# Patient Record
Sex: Female | Born: 1955 | Race: White | Hispanic: No | Marital: Single | Smoking: Never smoker
Health system: Southern US, Community
[De-identification: ages and names within clinical notes are randomized; demographics above are authoritative.]

## PROBLEM LIST (undated history)

## (undated) DIAGNOSIS — R55 Syncope and collapse: Secondary | ICD-10-CM

## (undated) DIAGNOSIS — N959 Unspecified menopausal and perimenopausal disorder: Secondary | ICD-10-CM

## (undated) DIAGNOSIS — F325 Major depressive disorder, single episode, in full remission: Secondary | ICD-10-CM

## (undated) DIAGNOSIS — G4700 Insomnia, unspecified: Secondary | ICD-10-CM

## (undated) DIAGNOSIS — E7800 Pure hypercholesterolemia, unspecified: Secondary | ICD-10-CM

## (undated) DIAGNOSIS — E079 Disorder of thyroid, unspecified: Secondary | ICD-10-CM

## (undated) DIAGNOSIS — K259 Gastric ulcer, unspecified as acute or chronic, without hemorrhage or perforation: Secondary | ICD-10-CM

## (undated) HISTORY — DX: Syncope and collapse: R55

## (undated) HISTORY — DX: Major depressive disorder, single episode, in full remission: F32.5

## (undated) HISTORY — PX: KNEE ARTHROSCOPY: SUR90

## (undated) HISTORY — PX: ESOPHAGOGASTRODUODENOSCOPY: SHX1529

## (undated) HISTORY — DX: Pure hypercholesterolemia, unspecified: E78.00

## (undated) HISTORY — PX: OTHER SURGICAL HISTORY: SHX169

## (undated) HISTORY — DX: Unspecified menopausal and perimenopausal disorder: N95.9

## (undated) HISTORY — DX: Gastric ulcer, unspecified as acute or chronic, without hemorrhage or perforation: K25.9

## (undated) HISTORY — DX: Disorder of thyroid, unspecified: E07.9

## (undated) HISTORY — PX: BUNIONECTOMY: SHX129

## (undated) HISTORY — PX: CHOLECYSTECTOMY: SHX55

## (undated) HISTORY — DX: Insomnia, unspecified: G47.00

## (undated) HISTORY — PX: COLONOSCOPY: SHX174

---

## 1998-01-29 ENCOUNTER — Ambulatory Visit (HOSPITAL_COMMUNITY): Admission: RE | Admit: 1998-01-29 | Discharge: 1998-01-29 | Payer: Self-pay | Admitting: Obstetrics and Gynecology

## 1998-06-26 ENCOUNTER — Encounter: Payer: Self-pay | Admitting: Orthopedic Surgery

## 1998-06-26 ENCOUNTER — Ambulatory Visit (HOSPITAL_COMMUNITY): Admission: RE | Admit: 1998-06-26 | Discharge: 1998-06-26 | Payer: Self-pay | Admitting: Orthopedic Surgery

## 1999-04-18 ENCOUNTER — Encounter: Payer: Self-pay | Admitting: Obstetrics and Gynecology

## 1999-04-18 ENCOUNTER — Ambulatory Visit (HOSPITAL_COMMUNITY): Admission: RE | Admit: 1999-04-18 | Discharge: 1999-04-18 | Payer: Self-pay | Admitting: Obstetrics and Gynecology

## 1999-09-05 ENCOUNTER — Other Ambulatory Visit: Admission: RE | Admit: 1999-09-05 | Discharge: 1999-09-05 | Payer: Self-pay | Admitting: Obstetrics and Gynecology

## 2000-05-17 ENCOUNTER — Ambulatory Visit (HOSPITAL_COMMUNITY): Admission: RE | Admit: 2000-05-17 | Discharge: 2000-05-17 | Payer: Self-pay | Admitting: Gastroenterology

## 2000-05-20 ENCOUNTER — Encounter: Payer: Self-pay | Admitting: Obstetrics and Gynecology

## 2000-05-20 ENCOUNTER — Ambulatory Visit (HOSPITAL_COMMUNITY): Admission: RE | Admit: 2000-05-20 | Discharge: 2000-05-20 | Payer: Self-pay | Admitting: Obstetrics and Gynecology

## 2000-10-20 ENCOUNTER — Other Ambulatory Visit: Admission: RE | Admit: 2000-10-20 | Discharge: 2000-10-20 | Payer: Self-pay | Admitting: Obstetrics and Gynecology

## 2001-02-11 ENCOUNTER — Ambulatory Visit (HOSPITAL_COMMUNITY): Admission: RE | Admit: 2001-02-11 | Discharge: 2001-02-11 | Payer: Self-pay | Admitting: Obstetrics and Gynecology

## 2001-02-11 ENCOUNTER — Encounter: Payer: Self-pay | Admitting: Obstetrics and Gynecology

## 2001-09-16 ENCOUNTER — Encounter: Payer: Self-pay | Admitting: Obstetrics and Gynecology

## 2001-09-16 ENCOUNTER — Ambulatory Visit (HOSPITAL_COMMUNITY): Admission: RE | Admit: 2001-09-16 | Discharge: 2001-09-16 | Payer: Self-pay | Admitting: Obstetrics and Gynecology

## 2001-12-13 ENCOUNTER — Other Ambulatory Visit: Admission: RE | Admit: 2001-12-13 | Discharge: 2001-12-13 | Payer: Self-pay | Admitting: Obstetrics and Gynecology

## 2002-12-21 ENCOUNTER — Ambulatory Visit (HOSPITAL_COMMUNITY): Admission: RE | Admit: 2002-12-21 | Discharge: 2002-12-21 | Payer: Self-pay | Admitting: Obstetrics and Gynecology

## 2002-12-21 ENCOUNTER — Encounter: Payer: Self-pay | Admitting: Obstetrics and Gynecology

## 2003-07-27 ENCOUNTER — Other Ambulatory Visit: Admission: RE | Admit: 2003-07-27 | Discharge: 2003-07-27 | Payer: Self-pay | Admitting: Obstetrics and Gynecology

## 2004-05-02 ENCOUNTER — Ambulatory Visit (HOSPITAL_COMMUNITY): Admission: RE | Admit: 2004-05-02 | Discharge: 2004-05-02 | Payer: Self-pay | Admitting: Obstetrics and Gynecology

## 2004-11-11 ENCOUNTER — Other Ambulatory Visit: Admission: RE | Admit: 2004-11-11 | Discharge: 2004-11-11 | Payer: Self-pay | Admitting: Obstetrics and Gynecology

## 2005-11-11 ENCOUNTER — Ambulatory Visit (HOSPITAL_COMMUNITY): Admission: RE | Admit: 2005-11-11 | Discharge: 2005-11-11 | Payer: Self-pay | Admitting: Obstetrics and Gynecology

## 2005-11-27 ENCOUNTER — Encounter: Admission: RE | Admit: 2005-11-27 | Discharge: 2005-11-27 | Payer: Self-pay | Admitting: Obstetrics and Gynecology

## 2007-01-07 ENCOUNTER — Ambulatory Visit (HOSPITAL_COMMUNITY): Admission: RE | Admit: 2007-01-07 | Discharge: 2007-01-07 | Payer: Self-pay | Admitting: Obstetrics and Gynecology

## 2007-10-11 ENCOUNTER — Encounter: Admission: RE | Admit: 2007-10-11 | Discharge: 2007-10-11 | Payer: Self-pay | Admitting: Neurological Surgery

## 2007-11-10 ENCOUNTER — Observation Stay (HOSPITAL_COMMUNITY): Admission: RE | Admit: 2007-11-10 | Discharge: 2007-11-11 | Payer: Self-pay | Admitting: Neurological Surgery

## 2007-11-25 ENCOUNTER — Encounter: Admission: RE | Admit: 2007-11-25 | Discharge: 2007-11-25 | Payer: Self-pay | Admitting: Neurological Surgery

## 2008-05-04 ENCOUNTER — Ambulatory Visit (HOSPITAL_COMMUNITY): Admission: RE | Admit: 2008-05-04 | Discharge: 2008-05-04 | Payer: Self-pay | Admitting: Obstetrics and Gynecology

## 2009-01-24 ENCOUNTER — Encounter: Admission: RE | Admit: 2009-01-24 | Discharge: 2009-01-24 | Payer: Self-pay | Admitting: Internal Medicine

## 2009-02-07 ENCOUNTER — Encounter (INDEPENDENT_AMBULATORY_CARE_PROVIDER_SITE_OTHER): Payer: Self-pay | Admitting: General Surgery

## 2009-02-07 ENCOUNTER — Ambulatory Visit (HOSPITAL_COMMUNITY): Admission: EM | Admit: 2009-02-07 | Discharge: 2009-02-07 | Payer: Self-pay | Admitting: Emergency Medicine

## 2010-04-17 ENCOUNTER — Encounter: Admission: RE | Admit: 2010-04-17 | Discharge: 2010-04-17 | Payer: Self-pay | Admitting: Otolaryngology

## 2010-08-31 ENCOUNTER — Encounter: Payer: Self-pay | Admitting: Obstetrics and Gynecology

## 2010-11-17 LAB — URINALYSIS, ROUTINE W REFLEX MICROSCOPIC
Ketones, ur: NEGATIVE mg/dL
Nitrite: NEGATIVE
Urobilinogen, UA: 0.2 mg/dL (ref 0.0–1.0)
pH: 5.5 (ref 5.0–8.0)

## 2010-11-17 LAB — COMPREHENSIVE METABOLIC PANEL
Alkaline Phosphatase: 85 U/L (ref 39–117)
CO2: 24 mEq/L (ref 19–32)
Creatinine, Ser: 0.74 mg/dL (ref 0.4–1.2)
GFR calc non Af Amer: >60 mL/min (ref >60)
Glucose, Bld: 93 mg/dL (ref 70–99)
Potassium: 4.6 mEq/L (ref 3.5–5.1)
Sodium: 143 mEq/L (ref 135–145)

## 2010-11-17 LAB — LIPASE, BLOOD: Lipase: 26 U/L (ref 11–59)

## 2010-11-17 LAB — DIFFERENTIAL
Basophils Relative: 1 % (ref 0–1)
Eosinophils Relative: 2 % (ref 0–5)
Lymphs Abs: 1.4 10*3/uL (ref 0.7–4.0)
Monocytes Absolute: 0.4 10*3/uL (ref 0.1–1.0)
Monocytes Relative: 9 % (ref 3–12)

## 2010-11-17 LAB — CBC
Hemoglobin: 14.2 g/dL (ref 12.0–15.0)
MCV: 92.7 fL (ref 78.0–100.0)
RBC: 4.46 MIL/uL (ref 3.87–5.11)
WBC: 5.1 10*3/uL (ref 4.0–10.5)

## 2010-12-23 NOTE — H&P (Signed)
NAME:  Latoya Garcia NO.:  [id number]   MEDICAL RECORD NO.:  [id number]          PATIENT TYPE:  EMS   LOCATION:  ED                           FACILITY:  [facility]   PHYSICIAN:  Lorne Skeens. Hoxworth, M.D.DATE OF BIRTH:  1956/05/09   DATE OF ADMISSION:  02/06/2009  DATE OF DISCHARGE:                              HISTORY & PHYSICAL   CHIEF COMPLAINT:  Abdominal pain, nausea, vomiting.   HISTORY OF PRESENT ILLNESS:  Latoya Garcia is a 55 year old female  followed by Dr. Benjaman Kindler.  Approximately 5 months ago, she had an  episode of severe epigastric abdominal pain, nausea and vomiting  following a meal that lasted for several hours.  She felt well  afterwards until about 2 weeks ago when she again developed sudden  severe epigastric pain, nausea and vomiting following a meal.  This  again lasted a number of hours and then improved.  She saw Dr. Earl Gala  for evaluation.  Blood work was unremarkable.  Gallbladder ultrasound  was obtained showing some mild thickening of the gallbladder wall and  multiple gallstones.  Arrangements were made for elective referral for  cholecystectomy.  However again early this morning, she developed  another severe episode of pressure-like epigastric pain radiating to her  back with frequent nausea, vomiting and presents to the [facility]  Emergency Room.  Between episodes, she has felt well.  There is no  fever, chills or jaundice.  No chronic GI complaints.  Bowel movements  have been normal.   PAST SURGICAL HISTORY:  Laparotomy for ovarian cysts with a low midline  incision.   PAST MEDICAL HISTORY:  She is followed for elevated cholesterol,  hypothyroidism and hypercholesterolemia.   ADMISSION MEDICATIONS:  1. Synthroid.  2. Zocor.  3. Cymbalta   ALLERGIES:  NO KNOWN DRUG ALLERGIES.   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  Single.  Does not smoke cigarettes. Drinks occasional alcohol socially.   FAMILY HISTORY:  Noncontributory.   REVIEW OF SYSTEMS:  GENERAL:  No fever, chills, malaise.  HEENT:  No  vision, hearing, swallowing problems.  RESPIRATORY:  No shortness  breath, cough, wheezing or history of pulmonary disease.  CARDIAC:  No  chest pain, palpitations, swelling, history of heart disease.  ABDOMEN/GI:  As above.  GU:  No urinary burning, frequency or blood.  MUSCULOSKELETAL:  No joint pain or swelling.  NEUROLOGIC:  No syncope,  numbness, weakness.  HEMATOLOGIC:  No history of blood clots or abnormal  bleeding.   PHYSICAL EXAMINATION:  VITAL SIGNS:  Temperature is 98.5, pulse 70,  respirations 20, blood pressure 149/99. GENERAL:  Well-developed white  female in no acute distress.  SKIN:  Warm and dry.  No rash or infection.  HEENT:  No palpable mass or thyromegaly.  Sclerae nonicteric.  Oropharynx clear.  LYMPH NODES:  No cervical, subclavicular or inguinal nodes palpable.  LUNGS:  Clear.  No increased work of breathing.  CARDIAC:  Regular rate and rhythm.  No murmurs.  No edema.  ABDOMEN:  Mild epigastric tenderness.  No guarding or  peritoneal signs.  No discernible masses or organomegaly.  No hernias.  EXTREMITIES:  No joint swelling or deformity.  NEUROLOGIC:  Alert, oriented.  Motor and sensory exams are grossly  normal.   LABORATORY:  CBC, lipase, LFTs all within normal limits in the emergency  room.   DIAGNOSTICS:  Gallbladder ultrasound was repeated by the EDP showing  thickening of the gallbladder wall and gallstones.   ASSESSMENT/PLAN:  Repeated episodes of biliary colic and some degree of  cholecystitis.  Due to worsening and repeated episodes, the patient will  be admitted urgently for laparoscopic cholecystectomy with  cholangiogram.      Sharlet Salina T. Hoxworth, M.D.  Electronically Signed     BTH/MEDQ  D:  02/06/2009  T:  02/07/2009  Job:  [id number]   cc:   Theressa Millard, M.D.  Fax: [phone]

## 2010-12-23 NOTE — Op Note (Signed)
NAME:  TANAZIA, ACHEE NO.:  [id number]   MEDICAL RECORD NO.:  [id number]          PATIENT TYPE:  OIB   LOCATION:  0098                         FACILITY:  [facility]   PHYSICIAN:  Sharlet Salina T. Hoxworth, M.D.DATE OF BIRTH:  [birth year]   DATE OF PROCEDURE:  02/07/2009  DATE OF DISCHARGE:  02/07/2009                               OPERATIVE REPORT   PREOPERATIVE DIAGNOSES:  Is cholelithiasis and cholecystitis.   POSTOPERATIVE DIAGNOSES:  Is cholelithiasis and cholecystitis.   SURGICAL PROCEDURE:  Laparoscopic cholecystectomy with intraoperative  cholangiogram.   SURGEON:  Dr. Sharlet Salina T. Hoxworth.   ANESTHESIA:  General.   BRIEF HISTORY:  Ms. Avalon Coppinger is a 55 year old female generally  healthy, who presents with her third severe episode of epigastric pain,  nausea and vomiting today, and presents to the emergency room.  A  gallbladder ultrasound shows gallstones and thickened gallbladder wall.  LFTs are normal.  I have recommended proceeding with laparoscopic  cholecystectomy with cholangiogram.  The nature of the procedure,  indications, risks of bleeding, infection, bile leak and bile duct  injury were discussed and understood.  The patient is now brought to the  operating room for this procedure.   DESCRIPTION OF OPERATION:  The patient was brought to the operating room  and placed in the supine position on the operating table and general  endotracheal anesthesia was induced.  The abdomen was widely sterilely  prepped and draped.  She had received preoperative IV antibiotics, and  PAS were in place.  The correct patient and procedure were verified.  The trocar sites were infiltrated with local anesthesia.  A 1.5 cm  incision just beneath the umbilicus was used, and dissection was carried  down to the fascia which was incised transversely for 1 cm, and the  peritoneum entered under direct vision.  Through a mattress suture of #0  Vicryl the Hasson trocar was  placed and a pneumoperitoneum established.  Under direct vision, he 12 mm trocar was placed in the subxiphoid area  and two 5 mm trocars along the right subcostal margin.  The gallbladder  was edematous with mild acute inflammation.  There were some omental  adhesions which were taken down off the fundus of the gallbladder, which  was then elevated up over the liver and the infundibulum retracted  inferolaterally.  Further fibrofatty tissue was stripped off the neck of  the gallbladder toward the porta hepatis and peritoneum anterior to  posterior to closed triangle was incised.  The distal gallbladder and  closed triangle was thoroughly dissected and the cystic duct and cystic  artery were identified and dissected, and the cystic duct/gallbladder  junction dissected 360 degrees.  When the anatomy was clear, the cystic  duct was clipped at the gallbladder junction.  The cystic artery was  clipped and operative cholangiogram obtained through the cystic duct.  This showed good filling of the common bile duct, with free flow into  the duodenum and no filling defects.  There was partial filling of the  left hepatic duct in good filling of the right hepatic duct, likely due  to free flow distally into the duodenum.  The cholangiocath was removed  and the cystic duct was triply clipped proximally and divided.  The  cystic artery was further clipped and divided.  The gallbladder was then  dissected free from its bed using hook cautery.  It was removed through  the umbilicus.  Complete hemostasis was obtained of the gallbladder bed  with cautery and a Surgicel pack.  The right upper quadrant was  irrigated and hemostasis assured.  There was no evidence of trocar  injury.  Trocars were removed  and all CO2 evacuated.  The  mattress sutures secured to the umbilicus.  Skin incisions were closed  with subcuticular Monocryl and Dermabond.  The sponge, needle and  instrument counts were correct.   The  patient was taken to the recovery in good condition.      Lorne Skeens. Hoxworth, M.D.  Electronically Signed     BTH/MEDQ  D:  02/07/2009  T:  02/07/2009  Job:  [id number]

## 2010-12-23 NOTE — Op Note (Signed)
NAME:  Latoya Garcia, Latoya Garcia NO.:  [id number]   MEDICAL RECORD NO.:  [id number]          PATIENT TYPE:  OBV   LOCATION:  3599                         FACILITY:  [facility]   PHYSICIAN:  Tia Alert, MD     DATE OF BIRTH:  Feb 24, 1956   DATE OF PROCEDURE:  11/10/2007  DATE OF DISCHARGE:                               OPERATIVE REPORT   PREOPERATIVE DIAGNOSIS:  Cervical spondylosis with neural foraminal  stenosis and disk herniation, C6-7 and C7-T1, with left arm pain.   POSTOPERATIVE DIAGNOSIS:  Cervical spondylosis with neural foraminal  stenosis and disk herniation, C6-7 and C7-T1, with left arm pain.   PROCEDURE:  1. Decompressive anterior cervical diskectomy, C6-7, C7-T1, for      central canal and nerve root decompression  2. Anterior cervical arthrodesis, C6-7, C7-T1, utilizing 6-mm      corticocancellous allograft.  3. Anterior cervical plating, C6 to T1 utilizing a 37-mm Atlantis      Ventura plate.   SURGEON:  Marikay Alar, MD   ASSISTANT:  Aliene Beams, MD   ANESTHESIA:  General endotracheal.   COMPLICATIONS:  None apparent.   INDICATIONS FOR PROCEDURE:  Ms. Latoya Garcia is a 55 year old female who is  referred with severe left arm pain that seemed to follow both a C7 and a  T1 distribution.  She had an MRI followed by a CT scan which showed  cervical spondylosis at C6-7 and C7-T1.  She had a small preforaminal  disk herniation at C6-7 on the left and she had significant foraminal  stenosis at C7-T1 on the left side.  We talked about both the posterior  and the anterior approaches and I recommended an anterior approach at C6-  7 and C7-T1, though she had both anterior and posterior compression at  C7-T1.  She had significant neck pain with significant degenerative  disease at C6 with Modic endplate changes and I thought this would help  to improve her pain situation to approach this from an anterior  approach.  She got a second opinion from another  neurosurgeon, who  concurred; therefore, we moved forward with ACDF with plating at C6-7  and C7-T1 in the hopes of improving her pain syndrome.   DESCRIPTION OF PROCEDURE:  The patient was taken to the operating room  and after induction of adequate generalized endotracheal anesthesia, she  was placed in a supine position on the operating room table.  Her left  anterior cervical region was prepped with DuraPrep and draped in the  usual sterile fashion.  Five milliliters of local anesthesia were  injected and a transverse incision was made just over the clavicle on  the left side.  It was carried down to the platysma, which was elevated,  opened and undermined.  We then swept the omohyoid muscle medially and  inferiorly and dissected in a plane medial to the sternocleidomastoid  muscle and internal carotid artery and lateral to the trachea and  esophagus to expose C6-7 and C7-T1.  Intraoperative fluoroscopy  confirmed our levels.  Then we took down the longus colli muscles and  placed  the Shadowline retractors under these to expose C6-7 and C7-T1.  The annulus was incised.  The disk spaces were quite collapsed and  small.  We did the initial diskectomy with pituitary rongeurs and curved  curettes and then used the high-speed drill to drill the endplates to  prepare for later arthrodesis.  We drilled to a height of 6 mm at each  disk level.  We widened the foraminotomy anteriorly at C6-7 and C7-T1 on  the left, since it was her left arm that bothered her.  We then brought  in the operating microscope.  We opened the posterior longitudinal  ligament and utilizing the 1- and 2-mm Kerrison punches, removed the  posterior longitudinal ligament while undercutting the bodies of C7 and  T1 where we started.  A generous foraminotomy was performed.  The C8  nerve root was identified and well decompressed distal to its pedicle  into the foramen.  Once the decompression was complete, we palpated  with  a nerve hook and I felt we had achieved adequate decompression of the C8  nerve root.  We then did the exact same decompression at C6-7, opening  the posterior longitudinal ligament and then removing it in  circumferential fashion while undercutting the bodies of C6 and C7.  The  C7 nerve root was identified and decompressed distally into its foramen  distal to the pedicle.  We then palpated with a nerve hook to assure  adequate decompression.  We then inspected our nerve roots once again,  irrigated with saline solution and felt we had a good decompression of  the roots.  I therefore measured the interspaces to be 6 mm and used 6-  mm corticocancellous allografts and tapped these into position at C6-7  at C7-T1.  We then used a 37-mm Ventura plate and placed two 13-mm  variable-angle screws in the bodies of C6, C7 and T1 and these locked in  the plate by locking mechanism within the plate.  We then irrigated with  saline solution containing bacitracin, dried all bleeding points with  bipolar cautery and with Surgifoam and once meticulous hemostasis was  achieved, we closed the platysma with 0 Vicryl, closed the subcuticular  tissue with 3-0 Vicryl and closed the skin with Benzoin and Steri-  Strips.  The drapes were removed.  A sterile dressing was applied.  The  patient was awakened from general anesthesia and transferred to the  recovery room in stable condition.  At the end of the procedure, all  sponge, needle and instrument counts were correct.      Tia Alert, MD  Electronically Signed     DSJ/MEDQ  D:  11/10/2007  T:  11/10/2007  Job:  [id number]

## 2010-12-26 NOTE — Procedures (Signed)
[facility]  Patient:    Latoya Garcia, Latoya Garcia                        MRN: [id number] Proc. Date: 05/17/00 Adm. Date:  [id number] Attending:  Rich Brave CC:         Juluis Mire, M.D.   Procedure Report  PROCEDURE:  Colonoscopy.  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  FINDINGS:  Normal exam to the terminal ileum.  INFORMED CONSENT:  The nature, purpose, and risks of the procedure have been discussed with the patient who provided written consent.  SEDATION:  Fentanyl 75 mcg and Versed 6 mg IV without arrhythmias or desaturation.  DESCRIPTION OF PROCEDURE:  The Olympus adult colonoscope was advanced with some looping, overcome by taking out loops and applying some external abdominal compression, to the terminal ileum, placing the patient in the supine position to facilitate passage into the ascending colon.  The terminal ileum was entered for a short distance and appeared normal, and then pullback was performed in a gradual fashion.  The quality of the prep was very good, and it is felt that all areas were well seen.  This was essentially a normal examination.  No polyps, cancer, colitis, vascular malformations, or diverticular disease were observed.  Retroflexion could not be accomplished in the rectum despite repeated attempts, apparently due to a small rectal ampulla, but antegrade viewing disclosed no lesions. Pullout through the anal canal demonstrated mild to moderate internal hemorrhoids.  No biopsies were obtained.  The patient tolerated the procedure well, and there were no apparent complications.  IMPRESSION:  Normal colonoscopy.  PLAN:  Consider follow-up colonoscopy in five years in view of the  family history. DD:  05/17/00 TD:  05/18/00 Job: [id number]

## 2011-04-01 ENCOUNTER — Other Ambulatory Visit: Payer: Self-pay | Admitting: Otolaryngology

## 2011-04-01 DIAGNOSIS — D333 Benign neoplasm of cranial nerves: Secondary | ICD-10-CM

## 2011-04-20 ENCOUNTER — Other Ambulatory Visit: Payer: Self-pay

## 2011-04-21 ENCOUNTER — Inpatient Hospital Stay (HOSPITAL_COMMUNITY): Admission: RE | Admit: 2011-04-21 | Payer: Self-pay | Source: Ambulatory Visit

## 2011-04-29 ENCOUNTER — Ambulatory Visit (HOSPITAL_COMMUNITY)
Admission: RE | Admit: 2011-04-29 | Discharge: 2011-04-29 | Disposition: A | Discharge status: Home / Self Care | Payer: 59 | Source: Ambulatory Visit | Attending: Otolaryngology | Admitting: Otolaryngology

## 2011-04-29 DIAGNOSIS — D219 Benign neoplasm of connective and other soft tissue, unspecified: Secondary | ICD-10-CM | POA: Insufficient documentation

## 2011-04-29 DIAGNOSIS — H9190 Unspecified hearing loss, unspecified ear: Secondary | ICD-10-CM | POA: Insufficient documentation

## 2011-04-29 MED ORDER — GADOBENATE DIMEGLUMINE 529 MG/ML IV SOLN
14.0000 mL | Freq: Once | INTRAVENOUS | Status: AC
  Administered 2011-04-29: 14 mL via INTRAVENOUS

## 2011-05-04 LAB — DIFFERENTIAL
Basophils Absolute: 0
Basophils Relative: 1
Eosinophils Relative: 1
Monocytes Absolute: 0.5
Monocytes Relative: 7

## 2011-05-04 LAB — BASIC METABOLIC PANEL
BUN: 10
Calcium: 9.5
Chloride: 104
Creatinine, Ser: 0.8
GFR calc non Af Amer: >60

## 2011-05-04 LAB — CBC
MCHC: 33.8
MCV: 94.4
Platelets: 264
WBC: 6.7

## 2011-05-04 LAB — APTT: aPTT: 29

## 2012-01-26 ENCOUNTER — Other Ambulatory Visit (HOSPITAL_COMMUNITY): Payer: Self-pay | Admitting: Internal Medicine

## 2012-01-26 DIAGNOSIS — Z1231 Encounter for screening mammogram for malignant neoplasm of breast: Secondary | ICD-10-CM

## 2012-02-19 ENCOUNTER — Ambulatory Visit (HOSPITAL_COMMUNITY): Payer: 59 | Attending: Internal Medicine

## 2012-07-06 ENCOUNTER — Other Ambulatory Visit (HOSPITAL_COMMUNITY): Payer: Self-pay | Admitting: Otolaryngology

## 2012-07-06 DIAGNOSIS — D333 Benign neoplasm of cranial nerves: Secondary | ICD-10-CM

## 2012-07-08 ENCOUNTER — Other Ambulatory Visit (HOSPITAL_COMMUNITY): Payer: Self-pay | Admitting: Otolaryngology

## 2012-07-08 DIAGNOSIS — D333 Benign neoplasm of cranial nerves: Secondary | ICD-10-CM

## 2012-07-11 ENCOUNTER — Ambulatory Visit (HOSPITAL_COMMUNITY)
Admission: RE | Admit: 2012-07-11 | Discharge: 2012-07-11 | Disposition: A | Discharge status: Home / Self Care | Payer: 59 | Source: Ambulatory Visit | Attending: Otolaryngology | Admitting: Otolaryngology

## 2012-07-11 ENCOUNTER — Ambulatory Visit (HOSPITAL_COMMUNITY)
Admission: RE | Admit: 2012-07-11 | Discharge: 2012-07-11 | Disposition: A | Discharge status: Home / Self Care | Payer: 59 | Source: Ambulatory Visit | Attending: Internal Medicine | Admitting: Internal Medicine

## 2012-07-11 DIAGNOSIS — D333 Benign neoplasm of cranial nerves: Secondary | ICD-10-CM

## 2012-07-11 DIAGNOSIS — Z1231 Encounter for screening mammogram for malignant neoplasm of breast: Secondary | ICD-10-CM | POA: Insufficient documentation

## 2012-07-11 MED ORDER — GADOBENATE DIMEGLUMINE 529 MG/ML IV SOLN
15.0000 mL | Freq: Once | INTRAVENOUS | Status: AC
  Administered 2012-07-11: 13 mL via INTRAVENOUS

## 2012-08-30 DIAGNOSIS — D333 Benign neoplasm of cranial nerves: Secondary | ICD-10-CM | POA: Insufficient documentation

## 2012-09-06 ENCOUNTER — Other Ambulatory Visit: Payer: Self-pay | Admitting: Gastroenterology

## 2013-07-20 ENCOUNTER — Other Ambulatory Visit: Payer: Self-pay | Admitting: Dermatology

## 2013-09-22 ENCOUNTER — Other Ambulatory Visit (HOSPITAL_COMMUNITY): Payer: Self-pay | Admitting: Internal Medicine

## 2013-09-22 DIAGNOSIS — Z1231 Encounter for screening mammogram for malignant neoplasm of breast: Secondary | ICD-10-CM

## 2013-09-28 ENCOUNTER — Ambulatory Visit (HOSPITAL_COMMUNITY)
Admission: RE | Admit: 2013-09-28 | Discharge: 2013-09-28 | Disposition: A | Discharge status: Home / Self Care | Payer: 59 | Source: Ambulatory Visit | Attending: Internal Medicine | Admitting: Internal Medicine

## 2013-09-28 DIAGNOSIS — Z1231 Encounter for screening mammogram for malignant neoplasm of breast: Secondary | ICD-10-CM | POA: Insufficient documentation

## 2013-09-28 DIAGNOSIS — Z978 Presence of other specified devices: Secondary | ICD-10-CM | POA: Insufficient documentation

## 2013-10-13 ENCOUNTER — Other Ambulatory Visit (HOSPITAL_COMMUNITY): Payer: Self-pay | Admitting: Otolaryngology

## 2013-10-13 DIAGNOSIS — D333 Benign neoplasm of cranial nerves: Secondary | ICD-10-CM

## 2013-10-20 ENCOUNTER — Ambulatory Visit (HOSPITAL_COMMUNITY)
Admission: RE | Admit: 2013-10-20 | Discharge: 2013-10-20 | Disposition: A | Discharge status: Home / Self Care | Payer: 59 | Source: Ambulatory Visit | Attending: Otolaryngology | Admitting: Otolaryngology

## 2013-10-20 DIAGNOSIS — R55 Syncope and collapse: Secondary | ICD-10-CM | POA: Insufficient documentation

## 2013-10-20 DIAGNOSIS — D333 Benign neoplasm of cranial nerves: Secondary | ICD-10-CM | POA: Insufficient documentation

## 2013-10-20 DIAGNOSIS — H9190 Unspecified hearing loss, unspecified ear: Secondary | ICD-10-CM | POA: Insufficient documentation

## 2013-10-20 MED ORDER — GADOBENATE DIMEGLUMINE 529 MG/ML IV SOLN
13.0000 mL | Freq: Once | INTRAVENOUS | Status: AC | PRN
  Administered 2013-10-20: 13 mL via INTRAVENOUS

## 2015-04-18 ENCOUNTER — Encounter (HOSPITAL_COMMUNITY): Payer: Self-pay

## 2015-04-18 ENCOUNTER — Emergency Department (HOSPITAL_COMMUNITY)
Admission: EM | Admit: 2015-04-18 | Discharge: 2015-04-18 | Disposition: A | Discharge status: Home / Self Care | Payer: 59 | Attending: Emergency Medicine | Admitting: Emergency Medicine

## 2015-04-18 ENCOUNTER — Emergency Department (HOSPITAL_COMMUNITY): Payer: 59

## 2015-04-18 DIAGNOSIS — R29898 Other symptoms and signs involving the musculoskeletal system: Secondary | ICD-10-CM

## 2015-04-18 DIAGNOSIS — R269 Unspecified abnormalities of gait and mobility: Secondary | ICD-10-CM | POA: Diagnosis not present

## 2015-04-18 DIAGNOSIS — E039 Hypothyroidism, unspecified: Secondary | ICD-10-CM | POA: Diagnosis not present

## 2015-04-18 DIAGNOSIS — R55 Syncope and collapse: Secondary | ICD-10-CM | POA: Diagnosis present

## 2015-04-18 DIAGNOSIS — R202 Paresthesia of skin: Secondary | ICD-10-CM | POA: Diagnosis not present

## 2015-04-18 DIAGNOSIS — F329 Major depressive disorder, single episode, unspecified: Secondary | ICD-10-CM | POA: Insufficient documentation

## 2015-04-18 DIAGNOSIS — Z8742 Personal history of other diseases of the female genital tract: Secondary | ICD-10-CM | POA: Insufficient documentation

## 2015-04-18 DIAGNOSIS — Z8719 Personal history of other diseases of the digestive system: Secondary | ICD-10-CM | POA: Insufficient documentation

## 2015-04-18 DIAGNOSIS — G4700 Insomnia, unspecified: Secondary | ICD-10-CM | POA: Diagnosis not present

## 2015-04-18 DIAGNOSIS — R51 Headache: Secondary | ICD-10-CM | POA: Diagnosis not present

## 2015-04-18 DIAGNOSIS — Z79899 Other long term (current) drug therapy: Secondary | ICD-10-CM | POA: Insufficient documentation

## 2015-04-18 DIAGNOSIS — R2681 Unsteadiness on feet: Secondary | ICD-10-CM

## 2015-04-18 DIAGNOSIS — R519 Headache, unspecified: Secondary | ICD-10-CM

## 2015-04-18 LAB — CBC WITH DIFFERENTIAL/PLATELET
BASOS PCT: 0 % (ref 0–1)
Basophils Absolute: 0 10*3/uL (ref 0.0–0.1)
Eosinophils Absolute: 0 10*3/uL (ref 0.0–0.7)
Eosinophils Relative: 0 % (ref 0–5)
HEMATOCRIT: 41.3 % (ref 36.0–46.0)
Hemoglobin: 13.6 g/dL (ref 12.0–15.0)
Lymphocytes Relative: 11 % — ABNORMAL LOW (ref 12–46)
Lymphs Abs: 0.9 10*3/uL (ref 0.7–4.0)
MCH: 31.5 pg (ref 26.0–34.0)
MCHC: 32.9 g/dL (ref 30.0–36.0)
MCV: 95.6 fL (ref 78.0–100.0)
MONO ABS: 0.6 10*3/uL (ref 0.1–1.0)
MONOS PCT: 7 % (ref 3–12)
NEUTROS ABS: 7 10*3/uL (ref 1.7–7.7)
Neutrophils Relative %: 82 % — ABNORMAL HIGH (ref 43–77)
Platelets: 280 10*3/uL (ref 150–400)
RBC: 4.32 MIL/uL (ref 3.87–5.11)
RDW: 12.2 % (ref 11.5–15.5)
WBC: 8.5 10*3/uL (ref 4.0–10.5)

## 2015-04-18 LAB — BASIC METABOLIC PANEL
ANION GAP: 7 (ref 5–15)
BUN: 19 mg/dL (ref 6–20)
CALCIUM: 9.3 mg/dL (ref 8.9–10.3)
CO2: 26 mmol/L (ref 22–32)
CREATININE: 0.85 mg/dL (ref 0.44–1.00)
Chloride: 106 mmol/L (ref 101–111)
GFR calc Af Amer: >60 mL/min (ref >60)
GFR calc non Af Amer: >60 mL/min (ref >60)
GLUCOSE: 106 mg/dL — AB (ref 65–99)
Potassium: 4.7 mmol/L (ref 3.5–5.1)
Sodium: 139 mmol/L (ref 135–145)

## 2015-04-18 LAB — C-REACTIVE PROTEIN

## 2015-04-18 LAB — SEDIMENTATION RATE: Sed Rate: 19 mm/hr (ref 0–22)

## 2015-04-18 MED ORDER — GADOBENATE DIMEGLUMINE 529 MG/ML IV SOLN
15.0000 mL | Freq: Once | INTRAVENOUS | Status: AC | PRN
  Administered 2015-04-18: 15 mL via INTRAVENOUS

## 2015-04-18 NOTE — Discharge Instructions (Signed)

## 2015-04-18 NOTE — ED Notes (Signed)
Patient transported to MRI 

## 2015-04-18 NOTE — ED Provider Notes (Signed)
CSN: [id number]     Arrival date & time 04/18/15  1102 History   First MD Initiated Contact with Patient 04/18/15 1228     Chief Complaint  Patient presents with  . Loss of Consciousness     (Consider location/radiation/quality/duration/timing/severity/associated sxs/prior Treatment) HPI For several months the patient has had increasing problems with lower extremity paresthesias and episodes of general weakness of both lower extremities. This was particularly noticeable when the patient would be sitting and then go to standing. She states she will get a very tense, grabbing tightness in her head and then her legs would go limp like noodles. She at that time would need to use some support for a few minutes until the symptoms passed. He reports that the headache would resolve completely and she would still however have weakness limiting her ability to walk normally. These episodes have been waxing and waning for several months but now have become increasingly frequent, occurring several times a day. She denies bowel or bladder dysfunction. She reports occasional blurring of the vision. The headache is a tension, tight quality that resolves very quickly after its onset. 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 He does report that she had an MRI done in June that showed some inflammation in the area but nothing that was unanticipated per her discussion with her neurosurgeon. She has not have problems with fevers or general illness. She feels well between these episodes. He states that recently however she has been noting increasing general fatigue. She reports she does have a diagnosis of Sjogren syndrome which was given several years ago. She denies a specific treatment for this. Past Medical History  Diagnosis Date  . Thyroid disease     hypothyroidism  . Unspecified menopausal and postmenopausal disorder   . Pure hypercholesterolemia   . 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 Syncope and collapse   . Insomnia   . Gastric ulcer    Past Surgical History  Procedure Laterality Date  . Cholecystectomy    . Complete hysterectomy    . Knee arthroscopy Right   . Bunionectomy Bilateral   . Esophagogastroduodenoscopy    . Colonso    . Colonoscopy     Family History  Problem Relation Age of Onset  . Cancer - Lung Brother    Social History  Substance Use Topics  . Smoking status: Never Smoker   . Smokeless tobacco: None  . Alcohol Use: None   OB History    No data available     Review of Systems 10 Systems reviewed and are negative for acute change except as noted in the HPI.    Allergies  Review of patient's allergies indicates no known allergies.  Home Medications   Prior to Admission medications   Medication Sig Start Date End Date Taking? Authorizing Provider  ARIPiprazole (ABILIFY) 5 MG tablet Take 5 mg by mouth daily.   Yes Historical Provider, MD  DULoxetine (CYMBALTA) 30 MG capsule Take 90 mg by mouth daily.    Yes Historical Provider, MD  estradiol (VIVELLE-DOT) 0.05 MG/24HR patch Place 1 patch onto the skin 2 (two) times a week.   Yes Historical Provider, MD  ibuprofen (ADVIL,MOTRIN) 200 MG tablet Take 600 mg by mouth every 6 (six) hours as needed for mild pain.   Yes Historical Provider, MD  levothyroxine (SYNTHROID, LEVOTHROID) 50 MCG tablet Take 50 mcg by mouth daily before breakfast.  Yes Historical Provider, MD  lisdexamfetamine (VYVANSE) 70 MG capsule Take 70 mg by mouth daily.   Yes Historical Provider, MD  Magnesium 500 MG CAPS Take 500 mg by mouth daily.   Yes Historical Provider, MD  Multiple Vitamins-Minerals (MULTIVITAMIN WITH MINERALS) tablet Take 1 tablet by mouth daily.   Yes Historical Provider, MD  zolpidem (AMBIEN) 10 MG tablet Take 10 mg by mouth at bedtime as needed for sleep. Lorrin Mais   Yes Historical Provider, MD   BP 110/74 mmHg  Pulse 70  Temp(Src) 98.7 F (37.1 C)  (Oral)  Resp 14  Ht 5\' 4"  (1.626 m)  Wt 158 lb (71.668 kg)  BMI 27.11 kg/m2  SpO2 96% Physical Exam  Constitutional: She is oriented to person, place, and time. She appears well-developed and well-nourished.  HENT:  Head: Normocephalic and atraumatic.  Eyes: EOM are normal. Pupils are equal, round, and reactive to light.  Neck: Neck supple.  Cardiovascular: Normal rate, regular rhythm, normal heart sounds and intact distal pulses.   Pulmonary/Chest: Effort normal and breath sounds normal.  Abdominal: Soft. Bowel sounds are normal. She exhibits no distension. There is no tenderness.  Musculoskeletal: Normal range of motion. She exhibits no edema.  Neurological: She is alert and oriented to person, place, and time. She has normal strength. No cranial nerve deficit. She exhibits normal muscle tone. Coordination normal. GCS eye subscore is 4. GCS verbal subscore is 5. GCS motor subscore is 6.  Bilateral patellar reflexes are hyperreflexic. The patient has normal sharp dull differentiation both lower extremities. A 5 out of 5 muscle strength to extension and flexion. Patient's gait is symmetric and steady without evident weakness.  Skin: Skin is warm, dry and intact.  Skin does seem slightly thickened and taught on her face.  Psychiatric: She has a normal mood and affect.    ED Course  Procedures (including critical care time) Labs Review Labs Reviewed  BASIC METABOLIC PANEL - Abnormal; Notable for the following:    Glucose, Bld 106 (*)    All other components within normal limits  CBC WITH DIFFERENTIAL/PLATELET - Abnormal; Notable for the following:    Neutrophils Relative % 82 (*)    Lymphocytes Relative 11 (*)    All other components within normal limits  SEDIMENTATION RATE  C-REACTIVE PROTEIN  ANCA TITERS  MPO/PR-3 (ANCA) ANTIBODIES  ANTINUCLEAR ANTIBODIES, IFA  RHEUMATOID FACTOR    Imaging Review No results found. I have personally reviewed and evaluated these images and  lab results as part of my medical decision-making.   EKG Interpretation None     Consult: Dr. Janann Colonel neurology was consult it. He has added MRI and serology tests. At this point time the plan will be if MRI is negative for any urgent findings, neurology follow-up has been scheduled for the patient this month. MDM   Final diagnoses:  Paresthesia of bilateral legs  Neurologic gait dysfunction       Charlesetta Shanks, MD 04/18/15 1731

## 2015-04-18 NOTE — Consult Note (Signed)
NEURO HOSPITALIST CONSULT NOTE   Referring [id number]   Reason for Consult: dizziness, HA, leg wekaness  HPI:                                                                                                                                          Latoya Garcia is an 59 y.o. female with history of right Vestibular swhannoma. She has had gamma knife surgery for this but recent MRI showed redemonstrated mass arising from the right internal auditory canal extending into the cerebellopontine angle with slight interval enlargement of the CP angle portion. Patient states for the past three months she has had episodes of dizziness and light headed sensation after standing or getting out of bed. She also notes while at work she will suddenly get a intense HA (band like), then her legs will become numb and she cannot move her legs for a small period of time. This will occur about 4 times a day.  There are periods of time where she stares off, per husband, but able to talk throughout these spells. She admits to having bilateral LE paresthesia which is old.    Past Medical History  Diagnosis Date  . Thyroid disease     hypothyroidism  . Unspecified menopausal and postmenopausal disorder   . Pure hypercholesterolemia   . 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 Syncope and collapse   . Insomnia   . Gastric ulcer     Past Surgical History  Procedure Laterality Date  . Cholecystectomy    . Complete hysterectomy    . Knee arthroscopy Right   . Bunionectomy Bilateral   . Esophagogastroduodenoscopy    . Colonso    . Colonoscopy      Family History  Problem Relation Age of Onset  . Cancer - Lung Brother      Social History:  reports that she has never smoked. She does not have any smokeless tobacco history on file. Her alcohol and drug histories are not on file.  No Known Allergies  MEDICATIONS:                                                                                                                      No current facility-administered medications  for this encounter.   Current Outpatient Prescriptions  Medication Sig Dispense Refill  . ARIPiprazole (ABILIFY) 5 MG tablet Take 5 mg by mouth daily.    . DULoxetine (CYMBALTA) 30 MG capsule Take 90 mg by mouth daily.     Marland Kitchen estradiol (VIVELLE-DOT) 0.05 MG/24HR patch Place 1 patch onto the skin 2 (two) times a week.    Marland Kitchen ibuprofen (ADVIL,MOTRIN) 200 MG tablet Take 600 mg by mouth every 6 (six) hours as needed for mild pain.    Marland Kitchen levothyroxine (SYNTHROID, LEVOTHROID) 50 MCG tablet Take 50 mcg by mouth daily before breakfast.    . lisdexamfetamine (VYVANSE) 70 MG capsule Take 70 mg by mouth daily.    . Magnesium 500 MG CAPS Take 500 mg by mouth daily.    . Multiple Vitamins-Minerals (MULTIVITAMIN WITH MINERALS) tablet Take 1 tablet by mouth daily.    Marland Kitchen zolpidem (AMBIEN) 10 MG tablet Take 10 mg by mouth at bedtime as needed for sleep. ambien        ROS:                                                                                                                                       History obtained from the patient  General ROS: negative for - chills, fatigue, fever, night sweats, weight gain or weight loss Psychological ROS: negative for - behavioral disorder, hallucinations, memory difficulties, mood swings or suicidal ideation Ophthalmic ROS: negative for - blurry vision, double vision, eye pain or loss of vision ENT ROS: negative for - epistaxis, nasal discharge, oral lesions, sore throat, tinnitus or vertigo Allergy and Immunology ROS: negative for - hives or itchy/watery eyes Hematological and Lymphatic ROS: negative for - bleeding problems, bruising or swollen lymph nodes Endocrine ROS: negative for - galactorrhea, hair pattern changes, polydipsia/polyuria or temperature intolerance Respiratory ROS: negative for - cough, hemoptysis, shortness of breath or  wheezing Cardiovascular ROS: negative for - chest pain, dyspnea on exertion, edema or irregular heartbeat Gastrointestinal ROS: negative for - abdominal pain, diarrhea, hematemesis, nausea/vomiting or stool incontinence Genito-Urinary ROS: negative for - dysuria, hematuria, incontinence or urinary frequency/urgency Musculoskeletal ROS: negative for - joint swelling or muscular weakness Neurological ROS: as noted in HPI Dermatological ROS: negative for rash and skin lesion changes   Blood pressure 122/83, pulse 72, temperature 98.7 F (37.1 C), temperature source Oral, resp. rate 14, height 5\' 4"  (1.626 m), weight 71.668 kg (158 lb), SpO2 95 %.   Neurologic Examination:  HEENT-  Normocephalic, no lesions, without obvious abnormality.  Normal external eye and conjunctiva.  Normal TM's bilaterally.  Normal auditory canals and external ears. Normal external nose, mucus membranes and septum.  Normal pharynx. Cardiovascular- S1, S2 normal, pulses palpable throughout   Lungs- chest clear, no wheezing, rales, normal symmetric air entry Abdomen- normal findings: bowel sounds normal Extremities- no joint deformities, effusion, or inflammation Lymph-no adenopathy palpable Musculoskeletal-no joint tenderness, deformity or swelling Skin-warm and dry, no hyperpigmentation, vitiligo, or suspicious lesions  Neurological Examination Mental Status: Alert, oriented, thought content appropriate.  Speech fluent without evidence of aphasia.  Able to follow 3 step commands without difficulty. Cranial Nerves: II: Discs flat bilaterally; Visual fields grossly normal, pupils equal, round, reactive to light and accommodation III,IV, VI: ptosis not present, extra-ocular motions intact bilaterally V,VII: smile asymmetric on the left, facial light touch sensation normal bilaterally VIII: hearing normal  bilaterally IX,X: uvula rises symmetrically XI: bilateral shoulder shrug XII: midline tongue extension Motor: Right : Upper extremity   5/5    Left:     Upper extremity   5/5  Lower extremity   5/5     Lower extremity   5/5 Tone and bulk:normal tone throughout; no atrophy noted Sensory: Pinprick and light touch intact throughout, bilaterally Deep Tendon Reflexes: 3+ and symmetric throughout Plantars: Right: downgoing   Left: downgoing Cerebellar: normal finger-to-nose, normal heel-to-shin test Gait: normal gait and station      Lab Results: Basic Metabolic Panel: No results for input(s): NA, K, CL, CO2, GLUCOSE, BUN, CREATININE, CALCIUM, MG, PHOS in the last 168 hours.  Liver Function Tests: No results for input(s): AST, ALT, ALKPHOS, BILITOT, PROT, ALBUMIN in the last 168 hours. No results for input(s): LIPASE, AMYLASE in the last 168 hours. No results for input(s): AMMONIA in the last 168 hours.  CBC:  Recent Labs Lab 04/18/15 1331  WBC 8.5  NEUTROABS 7.0  HGB 13.6  HCT 41.3  MCV 95.6  PLT 280    Cardiac Enzymes: No results for input(s): CKTOTAL, CKMB, CKMBINDEX, TROPONINI in the last 168 hours.  Lipid Panel: No results for input(s): CHOL, TRIG, HDL, CHOLHDL, VLDL, LDLCALC in the last 168 hours.  CBG: No results for input(s): GLUCAP in the last 168 hours.  Microbiology: No results found for this or any previous visit.  Coagulation Studies: No results for input(s): LABPROT, INR in the last 72 hours.  Imaging: No results found.     Assessment and plan per attending neurologist  Etta Quill PA-C [facility] [phone]  04/18/2015, 2:11 PM   Assessment/Plan: 59 YO female with multiple vague complaints including dizziness, HA, bilateral transient leg numbness, and fatigue. Etiology unclear.  She has known Dx of right vestibular swhannoma and recent MRI showing expansion of lesion.    Recommend: 1) MRI brain W/WO contrast.  2) ANA,  RF, P-ANCA, C-ANCA, SED rate, CRP 3) If MRI stable suggest outpatient follow up. Would consider EEG and possible cardiac monitoring as outpatient    Jim Like, DO [facility] [phone]  If 7pm- 7am, please page neurology on call as listed in [place].

## 2015-04-18 NOTE — ED Notes (Signed)
Pt presents with 2 month h/o dizziness followed by numbness to both legs.  Pt reports symptoms worsen while sitting and standing.  Pt reports with symptoms, she gets frontal headache, with intermittent blurred vision.

## 2015-04-19 LAB — RHEUMATOID FACTOR: Rhuematoid fact SerPl-aCnc: <10 IU/mL (ref 0.0–13.9)

## 2015-05-07 ENCOUNTER — Ambulatory Visit: Payer: 59 | Admitting: Neurology

## 2015-05-14 ENCOUNTER — Ambulatory Visit: Payer: 59 | Admitting: Neurology

## 2015-05-14 DIAGNOSIS — F32A Depression, unspecified: Secondary | ICD-10-CM | POA: Insufficient documentation

## 2015-05-14 DIAGNOSIS — F988 Other specified behavioral and emotional disorders with onset usually occurring in childhood and adolescence: Secondary | ICD-10-CM | POA: Insufficient documentation

## 2015-05-14 DIAGNOSIS — E039 Hypothyroidism, unspecified: Secondary | ICD-10-CM | POA: Insufficient documentation

## 2015-05-15 DIAGNOSIS — G919 Hydrocephalus, unspecified: Secondary | ICD-10-CM | POA: Insufficient documentation

## 2015-08-11 HISTORY — PX: BREAST BIOPSY: SHX20

## 2015-08-22 MED FILL — DULoxetine HCL 30 MG CPEP: 30 | 90 days supply | Qty: 270 | Fill #0

## 2015-08-30 MED FILL — VYVANSE 70 MG CAPSULE: 70 | 30 days supply | Qty: 30 | Fill #0

## 2015-09-09 MED FILL — ZOLPIDEM TARTRATE 10 MG TAB: 10 | 30 days supply | Qty: 30 | Fill #1

## 2015-10-03 MED FILL — ESTRADIOL 0.05 MG PATCH: 0.05 | 28 days supply | Qty: 8 | Fill #3

## 2015-10-03 MED FILL — VYVANSE 70 MG CAPSULE: 70 | 30 days supply | Qty: 30 | Fill #0

## 2015-10-09 MED FILL — ZOLPIDEM TARTRATE 10 MG TAB: 10 | 30 days supply | Qty: 30 | Fill #2

## 2015-10-25 MED FILL — ARIPiprazole 5 MG TABS: 5 | 90 days supply | Qty: 90 | Fill #0

## 2015-11-05 DIAGNOSIS — F902 Attention-deficit hyperactivity disorder, combined type: Secondary | ICD-10-CM | POA: Diagnosis not present

## 2015-11-05 DIAGNOSIS — F332 Major depressive disorder, recurrent severe without psychotic features: Secondary | ICD-10-CM | POA: Diagnosis not present

## 2015-11-05 MED FILL — VYVANSE 70 MG CAPSULE: 70 | 30 days supply | Qty: 30 | Fill #0

## 2015-11-06 MED FILL — ZOLPIDEM TARTRATE 10 MG TAB: 10 | 30 days supply | Qty: 30 | Fill #0

## 2015-11-18 MED FILL — LEVOTHYROXINE 50 MCG TABLET: 50 | 90 days supply | Qty: 90 | Fill #1

## 2015-11-21 MED FILL — DULoxetine HCL 30 MG CPEP: 30 | 90 days supply | Qty: 270 | Fill #1

## 2015-12-03 MED FILL — VYVANSE 70 MG CAPSULE: 70 | 30 days supply | Qty: 30 | Fill #0

## 2015-12-11 MED FILL — ZOLPIDEM TARTRATE 10 MG TAB: 10 | 30 days supply | Qty: 30 | Fill #1

## 2016-01-08 MED FILL — ZOLPIDEM TARTRATE 10 MG TAB: 10 | 30 days supply | Qty: 30 | Fill #2

## 2016-01-09 MED FILL — ESTRADIOL 0.05 MG PATCH: 0.05 | 28 days supply | Qty: 8 | Fill #0

## 2016-01-09 MED FILL — VYVANSE 70 MG CAPSULE: 70 | 30 days supply | Qty: 30 | Fill #0

## 2016-01-27 DIAGNOSIS — Z9889 Other specified postprocedural states: Secondary | ICD-10-CM | POA: Diagnosis not present

## 2016-01-27 DIAGNOSIS — D333 Benign neoplasm of cranial nerves: Secondary | ICD-10-CM | POA: Diagnosis not present

## 2016-01-27 DIAGNOSIS — Z982 Presence of cerebrospinal fluid drainage device: Secondary | ICD-10-CM | POA: Diagnosis not present

## 2016-01-27 DIAGNOSIS — G919 Hydrocephalus, unspecified: Secondary | ICD-10-CM | POA: Diagnosis not present

## 2016-01-27 DIAGNOSIS — G629 Polyneuropathy, unspecified: Secondary | ICD-10-CM | POA: Diagnosis not present

## 2016-01-27 MED FILL — GABAPENTIN 300 MG CAPSULE: 300 | 30 days supply | Qty: 90 | Fill #0

## 2016-01-29 DIAGNOSIS — R51 Headache: Secondary | ICD-10-CM | POA: Diagnosis not present

## 2016-01-29 DIAGNOSIS — Z4541 Encounter for adjustment and management of cerebrospinal fluid drainage device: Secondary | ICD-10-CM | POA: Diagnosis not present

## 2016-01-29 DIAGNOSIS — Z982 Presence of cerebrospinal fluid drainage device: Secondary | ICD-10-CM | POA: Diagnosis not present

## 2016-02-03 MED FILL — ARIPiprazole 5 MG TABS: 5 | 90 days supply | Qty: 90 | Fill #1

## 2016-02-06 MED FILL — VYVANSE 70 MG CAPSULE: 70 | 30 days supply | Qty: 30 | Fill #0

## 2016-02-13 MED FILL — ZOLPIDEM TARTRATE 10 MG TAB: 10 | 30 days supply | Qty: 30 | Fill #0

## 2016-02-25 MED FILL — LEVOTHYROXINE 50 MCG TABLET: 50 | 90 days supply | Qty: 90 | Fill #0

## 2016-02-26 MED FILL — DULoxetine HCL 30 MG CPEP: 30 | 90 days supply | Qty: 270 | Fill #0

## 2016-03-10 MED FILL — VYVANSE 70 MG CAPSULE: 70 | 30 days supply | Qty: 30 | Fill #0

## 2016-03-19 DIAGNOSIS — M3501 Sicca syndrome with keratoconjunctivitis: Secondary | ICD-10-CM | POA: Diagnosis not present

## 2016-03-19 DIAGNOSIS — H35371 Puckering of macula, right eye: Secondary | ICD-10-CM | POA: Diagnosis not present

## 2016-04-09 MED FILL — ZOLPIDEM TARTRATE 10 MG TAB: 10 | 30 days supply | Qty: 30 | Fill #1

## 2016-04-14 MED FILL — VYVANSE 70 MG CAPSULE: 70 | 30 days supply | Qty: 30 | Fill #0

## 2016-04-29 DIAGNOSIS — H3581 Retinal edema: Secondary | ICD-10-CM | POA: Diagnosis not present

## 2016-04-29 DIAGNOSIS — H43811 Vitreous degeneration, right eye: Secondary | ICD-10-CM | POA: Diagnosis not present

## 2016-04-29 DIAGNOSIS — H43822 Vitreomacular adhesion, left eye: Secondary | ICD-10-CM | POA: Diagnosis not present

## 2016-04-29 DIAGNOSIS — H35373 Puckering of macula, bilateral: Secondary | ICD-10-CM | POA: Diagnosis not present

## 2016-05-20 DIAGNOSIS — D2272 Melanocytic nevi of left lower limb, including hip: Secondary | ICD-10-CM | POA: Diagnosis not present

## 2016-05-20 DIAGNOSIS — L821 Other seborrheic keratosis: Secondary | ICD-10-CM | POA: Diagnosis not present

## 2016-05-20 DIAGNOSIS — D225 Melanocytic nevi of trunk: Secondary | ICD-10-CM | POA: Diagnosis not present

## 2016-05-20 DIAGNOSIS — D485 Neoplasm of uncertain behavior of skin: Secondary | ICD-10-CM | POA: Diagnosis not present

## 2016-05-20 DIAGNOSIS — D1801 Hemangioma of skin and subcutaneous tissue: Secondary | ICD-10-CM | POA: Diagnosis not present

## 2016-05-20 DIAGNOSIS — L814 Other melanin hyperpigmentation: Secondary | ICD-10-CM | POA: Diagnosis not present

## 2016-05-20 DIAGNOSIS — L738 Other specified follicular disorders: Secondary | ICD-10-CM | POA: Diagnosis not present

## 2016-05-20 DIAGNOSIS — D2271 Melanocytic nevi of right lower limb, including hip: Secondary | ICD-10-CM | POA: Diagnosis not present

## 2016-05-26 DIAGNOSIS — F325 Major depressive disorder, single episode, in full remission: Secondary | ICD-10-CM | POA: Diagnosis not present

## 2016-05-26 DIAGNOSIS — Z1211 Encounter for screening for malignant neoplasm of colon: Secondary | ICD-10-CM | POA: Diagnosis not present

## 2016-05-26 DIAGNOSIS — D333 Benign neoplasm of cranial nerves: Secondary | ICD-10-CM | POA: Diagnosis not present

## 2016-05-26 DIAGNOSIS — E039 Hypothyroidism, unspecified: Secondary | ICD-10-CM | POA: Diagnosis not present

## 2016-05-26 DIAGNOSIS — E7800 Pure hypercholesterolemia, unspecified: Secondary | ICD-10-CM | POA: Diagnosis not present

## 2016-05-26 DIAGNOSIS — M3509 Sicca syndrome with other organ involvement: Secondary | ICD-10-CM | POA: Diagnosis not present

## 2016-05-26 DIAGNOSIS — Z0000 Encounter for general adult medical examination without abnormal findings: Secondary | ICD-10-CM | POA: Diagnosis not present

## 2016-05-26 DIAGNOSIS — Z1231 Encounter for screening mammogram for malignant neoplasm of breast: Secondary | ICD-10-CM | POA: Diagnosis not present

## 2016-05-26 DIAGNOSIS — Z780 Asymptomatic menopausal state: Secondary | ICD-10-CM | POA: Diagnosis not present

## 2016-05-26 MED FILL — VYVANSE 70 MG CAPSULE: 70 | 30 days supply | Qty: 30 | Fill #0

## 2016-05-26 MED FILL — LEVOTHYROXINE 50 MCG TABLET: 50 | 90 days supply | Qty: 90 | Fill #0

## 2016-05-26 MED FILL — ARIPiprazole 5 MG TABS: 5 | 30 days supply | Qty: 30 | Fill #0

## 2016-05-26 MED FILL — DULoxetine HCL 30 MG CPEP: 30 | 30 days supply | Qty: 90 | Fill #0

## 2016-05-27 ENCOUNTER — Other Ambulatory Visit: Payer: Self-pay | Admitting: Internal Medicine

## 2016-05-27 DIAGNOSIS — Z1231 Encounter for screening mammogram for malignant neoplasm of breast: Secondary | ICD-10-CM

## 2016-05-28 MED FILL — ATORVASTATIN 20 MG TABLET: 20 | 90 days supply | Qty: 90 | Fill #0

## 2016-06-10 DIAGNOSIS — Z780 Asymptomatic menopausal state: Secondary | ICD-10-CM | POA: Diagnosis not present

## 2016-06-10 DIAGNOSIS — Z13820 Encounter for screening for osteoporosis: Secondary | ICD-10-CM | POA: Diagnosis not present

## 2016-06-11 DIAGNOSIS — H5213 Myopia, bilateral: Secondary | ICD-10-CM | POA: Diagnosis not present

## 2016-06-11 DIAGNOSIS — F39 Unspecified mood [affective] disorder: Secondary | ICD-10-CM | POA: Diagnosis not present

## 2016-06-11 DIAGNOSIS — F908 Attention-deficit hyperactivity disorder, other type: Secondary | ICD-10-CM | POA: Diagnosis not present

## 2016-06-11 MED FILL — REXULTI 2 MG TABLET: 2 | 30 days supply | Qty: 30 | Fill #0

## 2016-06-11 MED FILL — ZOLPIDEM TARTRATE 5 MG TAB: 5 | 30 days supply | Qty: 30 | Fill #0

## 2016-06-12 DIAGNOSIS — F39 Unspecified mood [affective] disorder: Secondary | ICD-10-CM | POA: Diagnosis not present

## 2016-06-12 DIAGNOSIS — F908 Attention-deficit hyperactivity disorder, other type: Secondary | ICD-10-CM | POA: Diagnosis not present

## 2016-06-22 ENCOUNTER — Ambulatory Visit
Admission: RE | Admit: 2016-06-22 | Discharge: 2016-06-22 | Disposition: A | Discharge status: Home / Self Care | Payer: 59 | Source: Ambulatory Visit | Attending: Internal Medicine | Admitting: Internal Medicine

## 2016-06-22 ENCOUNTER — Other Ambulatory Visit: Payer: Self-pay | Admitting: Internal Medicine

## 2016-06-22 DIAGNOSIS — H524 Presbyopia: Secondary | ICD-10-CM | POA: Diagnosis not present

## 2016-06-22 DIAGNOSIS — Z1231 Encounter for screening mammogram for malignant neoplasm of breast: Secondary | ICD-10-CM

## 2016-06-22 DIAGNOSIS — H52223 Regular astigmatism, bilateral: Secondary | ICD-10-CM | POA: Diagnosis not present

## 2016-06-22 DIAGNOSIS — H5213 Myopia, bilateral: Secondary | ICD-10-CM | POA: Diagnosis not present

## 2016-06-24 MED FILL — VYVANSE 70 MG CAPSULE: 70 | 30 days supply | Qty: 30 | Fill #0

## 2016-06-25 ENCOUNTER — Other Ambulatory Visit: Payer: Self-pay | Admitting: Internal Medicine

## 2016-06-25 DIAGNOSIS — R928 Other abnormal and inconclusive findings on diagnostic imaging of breast: Secondary | ICD-10-CM

## 2016-07-01 ENCOUNTER — Other Ambulatory Visit: Payer: Self-pay | Admitting: Internal Medicine

## 2016-07-01 ENCOUNTER — Ambulatory Visit
Admission: RE | Admit: 2016-07-01 | Discharge: 2016-07-01 | Disposition: A | Discharge status: Home / Self Care | Payer: 59 | Source: Ambulatory Visit | Attending: Internal Medicine | Admitting: Internal Medicine

## 2016-07-01 DIAGNOSIS — R928 Other abnormal and inconclusive findings on diagnostic imaging of breast: Secondary | ICD-10-CM

## 2016-07-01 DIAGNOSIS — IMO0002 Reserved for concepts with insufficient information to code with codable children: Secondary | ICD-10-CM

## 2016-07-01 DIAGNOSIS — R229 Localized swelling, mass and lump, unspecified: Principal | ICD-10-CM

## 2016-07-01 MED FILL — DULoxetine HCL 30 MG CPEP: 30 | 90 days supply | Qty: 270 | Fill #1

## 2016-07-09 ENCOUNTER — Other Ambulatory Visit: Payer: Self-pay | Admitting: Internal Medicine

## 2016-07-09 DIAGNOSIS — IMO0002 Reserved for concepts with insufficient information to code with codable children: Secondary | ICD-10-CM

## 2016-07-09 DIAGNOSIS — R229 Localized swelling, mass and lump, unspecified: Principal | ICD-10-CM

## 2016-07-10 ENCOUNTER — Ambulatory Visit
Admission: RE | Admit: 2016-07-10 | Discharge: 2016-07-10 | Disposition: A | Discharge status: Home / Self Care | Payer: 59 | Source: Ambulatory Visit | Attending: Internal Medicine | Admitting: Internal Medicine

## 2016-07-10 DIAGNOSIS — N6310 Unspecified lump in the right breast, unspecified quadrant: Secondary | ICD-10-CM | POA: Diagnosis not present

## 2016-07-10 DIAGNOSIS — R229 Localized swelling, mass and lump, unspecified: Principal | ICD-10-CM

## 2016-07-10 DIAGNOSIS — IMO0002 Reserved for concepts with insufficient information to code with codable children: Secondary | ICD-10-CM

## 2016-07-10 DIAGNOSIS — N6031 Fibrosclerosis of right breast: Secondary | ICD-10-CM | POA: Diagnosis not present

## 2016-07-28 DIAGNOSIS — F908 Attention-deficit hyperactivity disorder, other type: Secondary | ICD-10-CM | POA: Diagnosis not present

## 2016-07-28 DIAGNOSIS — F39 Unspecified mood [affective] disorder: Secondary | ICD-10-CM | POA: Diagnosis not present

## 2016-07-28 MED FILL — ARIPiprazole 5 MG TABS: 5 | 30 days supply | Qty: 30 | Fill #0

## 2016-07-28 MED FILL — VYVANSE 60 MG CAPSULE: 60 | 30 days supply | Qty: 30 | Fill #0

## 2016-07-28 MED FILL — LORazepam 0.5 MG TABS: 0.5 | 20 days supply | Qty: 20 | Fill #0

## 2016-07-28 MED FILL — ZOLPIDEM TARTRATE 5 MG TAB: 5 | 30 days supply | Qty: 30 | Fill #1

## 2016-08-11 DIAGNOSIS — F39 Unspecified mood [affective] disorder: Secondary | ICD-10-CM | POA: Diagnosis not present

## 2016-08-27 MED FILL — ZOLPIDEM TARTRATE 5 MG TAB: 5 | 30 days supply | Qty: 30 | Fill #0

## 2016-08-27 MED FILL — ARIPiprazole 5 MG TABS: 5 | 30 days supply | Qty: 30 | Fill #1

## 2016-08-28 MED FILL — LEVOTHYROXINE 50 MCG TABLET: 50 | 90 days supply | Qty: 90 | Fill #1

## 2016-08-31 MED FILL — VYVANSE 60 MG CAPSULE: 60 | 30 days supply | Qty: 30 | Fill #0

## 2016-09-28 MED FILL — ZOLPIDEM TARTRATE 5 MG TAB: 5 | 30 days supply | Qty: 30 | Fill #1

## 2016-09-28 MED FILL — LORazepam 0.5 MG TABS: 0.5 | 20 days supply | Qty: 20 | Fill #1

## 2016-09-29 MED FILL — VYVANSE 60 MG CAPSULE: 60 | 30 days supply | Qty: 30 | Fill #0

## 2016-10-05 DIAGNOSIS — F908 Attention-deficit hyperactivity disorder, other type: Secondary | ICD-10-CM | POA: Diagnosis not present

## 2016-10-05 DIAGNOSIS — F39 Unspecified mood [affective] disorder: Secondary | ICD-10-CM | POA: Diagnosis not present

## 2016-10-05 MED FILL — ARIPiprazole 10 MG TABS: 10 | 30 days supply | Qty: 30 | Fill #0

## 2016-10-05 MED FILL — DULoxetine HCL 30 MG CPEP: 30 | 30 days supply | Qty: 90 | Fill #0

## 2016-10-19 MED FILL — ATORVASTATIN 20 MG TABLET: 20 | 90 days supply | Qty: 90 | Fill #1

## 2016-10-28 DIAGNOSIS — F39 Unspecified mood [affective] disorder: Secondary | ICD-10-CM | POA: Diagnosis not present

## 2016-10-28 DIAGNOSIS — F908 Attention-deficit hyperactivity disorder, other type: Secondary | ICD-10-CM | POA: Diagnosis not present

## 2016-11-02 MED FILL — ARIPiprazole 10 MG TABS: 10 | 30 days supply | Qty: 30 | Fill #1

## 2016-11-02 MED FILL — VYVANSE 50 MG CAPSULE: 50 | 30 days supply | Qty: 30 | Fill #0

## 2016-11-02 MED FILL — DULoxetine HCL 30 MG CPEP: 30 | 30 days supply | Qty: 90 | Fill #1

## 2016-11-11 MED FILL — LORazepam 0.5 MG TABS: 0.5 | 20 days supply | Qty: 20 | Fill #0

## 2016-11-11 MED FILL — ZOLPIDEM TARTRATE 5 MG TAB: 5 | 30 days supply | Qty: 30 | Fill #0

## 2016-11-17 DIAGNOSIS — F39 Unspecified mood [affective] disorder: Secondary | ICD-10-CM | POA: Diagnosis not present

## 2016-11-17 DIAGNOSIS — F908 Attention-deficit hyperactivity disorder, other type: Secondary | ICD-10-CM | POA: Diagnosis not present

## 2016-11-24 DIAGNOSIS — E7800 Pure hypercholesterolemia, unspecified: Secondary | ICD-10-CM | POA: Diagnosis not present

## 2016-11-24 DIAGNOSIS — Z6826 Body mass index (BMI) 26.0-26.9, adult: Secondary | ICD-10-CM | POA: Diagnosis not present

## 2016-11-24 DIAGNOSIS — Z79899 Other long term (current) drug therapy: Secondary | ICD-10-CM | POA: Diagnosis not present

## 2016-11-27 DIAGNOSIS — F39 Unspecified mood [affective] disorder: Secondary | ICD-10-CM | POA: Diagnosis not present

## 2016-12-02 MED FILL — DULoxetine HCL 30 MG CPEP: 30 | 30 days supply | Qty: 90 | Fill #0

## 2016-12-02 MED FILL — VYVANSE 50 MG CAPSULE: 50 | 30 days supply | Qty: 30 | Fill #0

## 2016-12-10 MED FILL — LEVOTHYROXINE 50 MCG TABLET: 50 | 90 days supply | Qty: 90 | Fill #2

## 2016-12-10 MED FILL — ZOLPIDEM TARTRATE 5 MG TAB: 5 | 30 days supply | Qty: 30 | Fill #1

## 2016-12-10 MED FILL — ARIPiprazole 10 MG TABS: 10 | 30 days supply | Qty: 30 | Fill #0

## 2016-12-10 MED FILL — LORazepam 0.5 MG TABS: 0.5 | 20 days supply | Qty: 20 | Fill #1

## 2016-12-30 MED FILL — DULoxetine HCL 30 MG CPEP: 30 | 30 days supply | Qty: 90 | Fill #1

## 2016-12-30 MED FILL — VYVANSE 50 MG CAPSULE: 50 | 30 days supply | Qty: 30 | Fill #0

## 2016-12-31 ENCOUNTER — Telehealth: Payer: 59 | Admitting: Physician Assistant

## 2016-12-31 DIAGNOSIS — J019 Acute sinusitis, unspecified: Secondary | ICD-10-CM

## 2016-12-31 DIAGNOSIS — B9689 Other specified bacterial agents as the cause of diseases classified elsewhere: Secondary | ICD-10-CM

## 2016-12-31 MED ORDER — AMOXICILLIN-POT CLAVULANATE 875-125 MG PO TABS: 1.0000 | ORAL_TABLET | Freq: Two times a day (BID) | ORAL | 0 refills | Status: DC

## 2016-12-31 MED FILL — AMOX TR-K CLV 875-125 MG TA: 875-125 | 7 days supply | Qty: 14 | Fill #0

## 2016-12-31 NOTE — Progress Notes (Signed)

## 2017-01-07 DIAGNOSIS — D1801 Hemangioma of skin and subcutaneous tissue: Secondary | ICD-10-CM | POA: Diagnosis not present

## 2017-01-07 DIAGNOSIS — D485 Neoplasm of uncertain behavior of skin: Secondary | ICD-10-CM | POA: Diagnosis not present

## 2017-01-07 DIAGNOSIS — D1800 Hemangioma unspecified site: Secondary | ICD-10-CM | POA: Diagnosis not present

## 2017-01-07 MED FILL — ZOLPIDEM TARTRATE 5 MG TAB: 5 | 30 days supply | Qty: 30 | Fill #0

## 2017-01-08 MED FILL — LORazepam 0.5 MG TABS: 0.5 | 20 days supply | Qty: 20 | Fill #0

## 2017-01-11 MED FILL — ARIPiprazole 10 MG TABS: 10 | 30 days supply | Qty: 30 | Fill #1

## 2017-01-26 MED FILL — ARIPiprazole 15 MG TABS: 15 | 30 days supply | Qty: 30 | Fill #0

## 2017-01-27 MED FILL — VYVANSE 50 MG CAPSULE: 50 | 30 days supply | Qty: 30 | Fill #0

## 2017-02-02 MED FILL — DULoxetine HCL 30 MG CPEP: 30 | 30 days supply | Qty: 90 | Fill #2

## 2017-02-21 MED FILL — LORazepam 0.5 MG TABS: 0.5 | 20 days supply | Qty: 20 | Fill #1

## 2017-02-21 MED FILL — ZOLPIDEM TARTRATE 5 MG TAB: 5 | 30 days supply | Qty: 30 | Fill #1

## 2017-02-22 DIAGNOSIS — F908 Attention-deficit hyperactivity disorder, other type: Secondary | ICD-10-CM | POA: Diagnosis not present

## 2017-02-22 DIAGNOSIS — F39 Unspecified mood [affective] disorder: Secondary | ICD-10-CM | POA: Diagnosis not present

## 2017-02-22 MED FILL — ATORVASTATIN 20 MG TABLET: 20 | 90 days supply | Qty: 90 | Fill #0

## 2017-02-22 MED FILL — ARIPiprazole 15 MG TABS: 15 | 30 days supply | Qty: 30 | Fill #0

## 2017-02-26 MED FILL — VYVANSE 50 MG CAPSULE: 50 | 30 days supply | Qty: 30 | Fill #0

## 2017-03-04 MED FILL — DULoxetine HCL 30 MG CPEP: 30 | 30 days supply | Qty: 90 | Fill #0

## 2017-03-15 MED FILL — LEVOTHYROXINE 50 MCG TABLET: 50 | 90 days supply | Qty: 90 | Fill #3

## 2017-03-23 MED FILL — ZOLPIDEM TARTRATE 5 MG TAB: 5 | 30 days supply | Qty: 30 | Fill #2

## 2017-03-23 MED FILL — LORazepam 0.5 MG TABS: 0.5 | 20 days supply | Qty: 20 | Fill #2

## 2017-03-26 MED FILL — VYVANSE 50 MG CAPSULE: 50 | 30 days supply | Qty: 30 | Fill #0

## 2017-04-05 MED FILL — DULoxetine HCL 30 MG CPEP: 30 | 30 days supply | Qty: 90 | Fill #1

## 2017-04-05 MED FILL — ARIPiprazole 15 MG TABS: 15 | 30 days supply | Qty: 30 | Fill #1

## 2017-04-23 MED FILL — LORazepam 0.5 MG TABS: 0.5 | 20 days supply | Qty: 20 | Fill #0

## 2017-04-23 MED FILL — ZOLPIDEM TARTRATE 5 MG TAB: 5 | 30 days supply | Qty: 30 | Fill #0

## 2017-04-28 MED FILL — VYVANSE 50 MG CAPSULE: 50 | 30 days supply | Qty: 30 | Fill #0

## 2017-05-06 MED FILL — DULoxetine HCL 30 MG CPEP: 30 | 30 days supply | Qty: 90 | Fill #0

## 2017-05-06 MED FILL — ARIPiprazole 15 MG TABS: 15 | 30 days supply | Qty: 30 | Fill #2

## 2017-05-25 DIAGNOSIS — F39 Unspecified mood [affective] disorder: Secondary | ICD-10-CM | POA: Diagnosis not present

## 2017-05-25 DIAGNOSIS — F4323 Adjustment disorder with mixed anxiety and depressed mood: Secondary | ICD-10-CM | POA: Diagnosis not present

## 2017-05-25 DIAGNOSIS — F908 Attention-deficit hyperactivity disorder, other type: Secondary | ICD-10-CM | POA: Diagnosis not present

## 2017-05-25 MED FILL — ZOLPIDEM TARTRATE 5 MG TABL: 5 | 30 days supply | Qty: 30 | Fill #0

## 2017-05-25 MED FILL — VYVANSE 50 MG CAPSULE: 50 | 30 days supply | Qty: 30 | Fill #0

## 2017-05-25 MED FILL — LORazepam 0.5 MG TABS: 0.5 | 20 days supply | Qty: 20 | Fill #0

## 2017-05-25 MED FILL — MIRTAZAPINE 15 MG TAB: 15 | 30 days supply | Qty: 30 | Fill #0

## 2017-06-07 MED FILL — DULoxetine HCL 30 MG CPEP: 30 | 30 days supply | Qty: 90 | Fill #1

## 2017-06-07 MED FILL — ARIPiprazole 15 MG TABS: 15 | 30 days supply | Qty: 30 | Fill #3

## 2017-06-22 MED FILL — LORazepam 0.5 MG TABS: 0.5 | 20 days supply | Qty: 20 | Fill #1

## 2017-06-22 MED FILL — ZOLPIDEM TARTRATE 5 MG TABL: 5 | 30 days supply | Qty: 30 | Fill #1

## 2017-06-22 MED FILL — LEVOTHYROXINE 50 MCG TABLET: 50 | 90 days supply | Qty: 90 | Fill #0

## 2017-06-22 MED FILL — MIRTAZAPINE 15 MG TAB: 15 | 30 days supply | Qty: 30 | Fill #1

## 2017-06-29 DIAGNOSIS — F908 Attention-deficit hyperactivity disorder, other type: Secondary | ICD-10-CM | POA: Diagnosis not present

## 2017-06-29 DIAGNOSIS — F39 Unspecified mood [affective] disorder: Secondary | ICD-10-CM | POA: Diagnosis not present

## 2017-06-29 DIAGNOSIS — F4323 Adjustment disorder with mixed anxiety and depressed mood: Secondary | ICD-10-CM | POA: Diagnosis not present

## 2017-06-30 MED FILL — VYVANSE 50 MG CAPSULE: 50 | 30 days supply | Qty: 30 | Fill #0

## 2017-07-06 DIAGNOSIS — H35371 Puckering of macula, right eye: Secondary | ICD-10-CM | POA: Diagnosis not present

## 2017-07-08 MED FILL — ARIPiprazole 15 MG TABS: 15 | 30 days supply | Qty: 30 | Fill #4

## 2017-07-08 MED FILL — DULoxetine HCL 30 MG CPEP: 30 | 30 days supply | Qty: 90 | Fill #2

## 2017-07-30 MED FILL — LORazepam 0.5 MG TABS: 0.5 | 20 days supply | Qty: 20 | Fill #0

## 2017-07-30 MED FILL — ZOLPIDEM TARTRATE 5 MG TABL: 5 | 30 days supply | Qty: 30 | Fill #0

## 2017-07-30 MED FILL — MIRTAZAPINE 15 MG TAB: 15 | 30 days supply | Qty: 30 | Fill #0

## 2017-08-02 MED FILL — VYVANSE 50 MG CAPSULE: 50 | 30 days supply | Qty: 30 | Fill #0

## 2017-08-09 MED FILL — DULoxetine HCL 30 MG CPEP: 30 | 30 days supply | Qty: 90 | Fill #3

## 2017-08-09 MED FILL — ARIPiprazole 15 MG TABS: 15 | 30 days supply | Qty: 30 | Fill #5

## 2017-08-31 MED FILL — ZOLPIDEM TARTRATE 5 MG TABL: 5 | 30 days supply | Qty: 30 | Fill #0

## 2017-08-31 MED FILL — MIRTAZAPINE 15 MG TAB: 15 | 30 days supply | Qty: 30 | Fill #0

## 2017-09-01 MED FILL — VYVANSE 50 MG CAPSULE: 50 | 30 days supply | Qty: 30 | Fill #0

## 2017-09-07 MED FILL — DULoxetine HCL 30 MG CPEP: 30 | 30 days supply | Qty: 90 | Fill #4

## 2017-09-07 MED FILL — LORazepam 0.5 MG TABS: 0.5 | 20 days supply | Qty: 20 | Fill #1

## 2017-09-07 MED FILL — ARIPiprazole 15 MG TABS: 15 | 30 days supply | Qty: 30 | Fill #0

## 2017-09-27 DIAGNOSIS — F4323 Adjustment disorder with mixed anxiety and depressed mood: Secondary | ICD-10-CM | POA: Diagnosis not present

## 2017-09-27 DIAGNOSIS — F908 Attention-deficit hyperactivity disorder, other type: Secondary | ICD-10-CM | POA: Diagnosis not present

## 2017-09-27 DIAGNOSIS — F39 Unspecified mood [affective] disorder: Secondary | ICD-10-CM | POA: Diagnosis not present

## 2017-09-28 MED FILL — ZOLPIDEM TARTRATE 5 MG TABL: 5 | 30 days supply | Qty: 30 | Fill #0

## 2017-09-30 MED FILL — ATORVASTATIN 20 MG TABLET: 20 | 90 days supply | Qty: 90 | Fill #1

## 2017-09-30 MED FILL — LEVOTHYROXINE 50 MCG TABLET: 50 | 90 days supply | Qty: 90 | Fill #1

## 2017-10-01 MED FILL — VYVANSE 50 MG CAPSULE: 50 | 30 days supply | Qty: 30 | Fill #0

## 2017-10-11 MED FILL — ARIPiprazole 15 MG TABS: 15 | 30 days supply | Qty: 30 | Fill #1

## 2017-10-11 MED FILL — DULoxetine HCL 30 MG CPEP: 30 | 30 days supply | Qty: 90 | Fill #5

## 2017-10-22 MED FILL — GABAPENTIN 300 MG CAPS: 300 | 30 days supply | Qty: 90 | Fill #0

## 2017-10-28 MED FILL — ZOLPIDEM TARTRATE 5 MG TABL: 5 | 30 days supply | Qty: 30 | Fill #1

## 2017-10-29 MED FILL — VYVANSE 50 MG CAPSULE: 50 | 30 days supply | Qty: 30 | Fill #0

## 2017-10-29 MED FILL — MIRTAZAPINE 15 MG TABLET: 15 | 30 days supply | Qty: 30 | Fill #1

## 2017-11-14 MED FILL — ARIPiprazole 15 MG TABS: 15 | 30 days supply | Qty: 30 | Fill #2

## 2017-11-15 MED FILL — LORazepam 0.5 MG TABS: 0.5 | 20 days supply | Qty: 20 | Fill #0

## 2017-11-15 MED FILL — DULoxetine HCL 30 MG CPEP: 30 | 30 days supply | Qty: 90 | Fill #0

## 2017-11-25 DIAGNOSIS — G500 Trigeminal neuralgia: Secondary | ICD-10-CM | POA: Diagnosis not present

## 2017-11-25 DIAGNOSIS — Z982 Presence of cerebrospinal fluid drainage device: Secondary | ICD-10-CM | POA: Diagnosis not present

## 2017-11-25 DIAGNOSIS — G919 Hydrocephalus, unspecified: Secondary | ICD-10-CM | POA: Diagnosis not present

## 2017-11-25 DIAGNOSIS — D333 Benign neoplasm of cranial nerves: Secondary | ICD-10-CM | POA: Diagnosis not present

## 2017-11-25 DIAGNOSIS — Z923 Personal history of irradiation: Secondary | ICD-10-CM | POA: Diagnosis not present

## 2017-11-29 MED FILL — MIRTAZAPINE 15 MG TABLET: 15 | 30 days supply | Qty: 30 | Fill #2

## 2017-11-30 MED FILL — ZOLPIDEM TARTRATE 5 MG TABL: 5 | 30 days supply | Qty: 30 | Fill #2

## 2017-11-30 MED FILL — VYVANSE 50 MG CAPSULE: 50 | 30 days supply | Qty: 30 | Fill #0

## 2017-12-13 MED FILL — ARIPiprazole 15 MG TABS: 15 | 30 days supply | Qty: 30 | Fill #3

## 2017-12-14 MED FILL — GABAPENTIN 300 MG CAPSULE: 300 | 30 days supply | Qty: 90 | Fill #1

## 2017-12-14 MED FILL — DULoxetine HCL 30 MG CPEP: 30 | 4 days supply | Qty: 12 | Fill #1

## 2017-12-17 MED FILL — DULoxetine HCL 30 MG CPEP: 30 | 30 days supply | Qty: 90 | Fill #2

## 2017-12-28 MED FILL — VYVANSE 50 MG CAPSULE: 50 | 30 days supply | Qty: 30 | Fill #0

## 2017-12-28 MED FILL — ZOLPIDEM TARTRATE 5 MG TAB: 5 | 30 days supply | Qty: 30 | Fill #3

## 2018-01-10 MED FILL — LEVOTHYROXINE 50 MCG TABLET: 50 | 90 days supply | Qty: 90 | Fill #2

## 2018-01-10 MED FILL — ARIPiprazole 15 MG TABS: 15 | 30 days supply | Qty: 30 | Fill #4

## 2018-01-14 MED FILL — LORazepam 0.5 MG TABS: 0.5 | 20 days supply | Qty: 20 | Fill #0

## 2018-01-14 MED FILL — MIRTAZAPINE 15 MG TABLET: 15 | 30 days supply | Qty: 30 | Fill #0

## 2018-01-17 MED FILL — DULoxetine HCL 30 MG CPEP: 30 | 30 days supply | Qty: 90 | Fill #3

## 2018-01-20 DIAGNOSIS — F39 Unspecified mood [affective] disorder: Secondary | ICD-10-CM | POA: Diagnosis not present

## 2018-02-01 DIAGNOSIS — F4323 Adjustment disorder with mixed anxiety and depressed mood: Secondary | ICD-10-CM | POA: Diagnosis not present

## 2018-02-01 DIAGNOSIS — F908 Attention-deficit hyperactivity disorder, other type: Secondary | ICD-10-CM | POA: Diagnosis not present

## 2018-02-01 DIAGNOSIS — F331 Major depressive disorder, recurrent, moderate: Secondary | ICD-10-CM | POA: Diagnosis not present

## 2018-02-01 MED FILL — ZOLPIDEM TART ER 6.25 MG TA: 6.25 | 30 days supply | Qty: 30 | Fill #0

## 2018-02-01 MED FILL — ARMODAFINIL 150 MG TABLET: 150 | 30 days supply | Qty: 30 | Fill #0

## 2018-02-15 DIAGNOSIS — F331 Major depressive disorder, recurrent, moderate: Secondary | ICD-10-CM | POA: Diagnosis not present

## 2018-02-15 MED FILL — ARIPiprazole 15 MG TABS: 15 | 30 days supply | Qty: 30 | Fill #5

## 2018-02-16 MED FILL — DULoxetine HCL 30 MG CPEP: 30 | 30 days supply | Qty: 90 | Fill #4

## 2018-02-24 DIAGNOSIS — F908 Attention-deficit hyperactivity disorder, other type: Secondary | ICD-10-CM | POA: Diagnosis not present

## 2018-02-24 DIAGNOSIS — F331 Major depressive disorder, recurrent, moderate: Secondary | ICD-10-CM | POA: Diagnosis not present

## 2018-02-24 DIAGNOSIS — F4323 Adjustment disorder with mixed anxiety and depressed mood: Secondary | ICD-10-CM | POA: Diagnosis not present

## 2018-02-24 MED FILL — SERTRALINE HCL 50 MG TABLET: 50 | 30 days supply | Qty: 30 | Fill #0

## 2018-03-01 MED FILL — VYVANSE 50 MG CAPSULE: 50 | 30 days supply | Qty: 30 | Fill #0

## 2018-03-01 MED FILL — ZOLPIDEM TART ER 6.25 MG TA: 6.25 | 30 days supply | Qty: 30 | Fill #1

## 2018-03-02 MED FILL — GABAPENTIN 300 MG CAPSULE: 300 | 30 days supply | Qty: 90 | Fill #0

## 2018-03-02 MED FILL — OXcarbazepine 150 MG TABS: 150 | 15 days supply | Qty: 60 | Fill #0

## 2018-03-04 DIAGNOSIS — F331 Major depressive disorder, recurrent, moderate: Secondary | ICD-10-CM | POA: Diagnosis not present

## 2018-03-04 DIAGNOSIS — J039 Acute tonsillitis, unspecified: Secondary | ICD-10-CM | POA: Diagnosis not present

## 2018-03-18 DIAGNOSIS — F331 Major depressive disorder, recurrent, moderate: Secondary | ICD-10-CM | POA: Diagnosis not present

## 2018-03-21 MED FILL — DULoxetine HCL 30 MG CPEP: 30 | 30 days supply | Qty: 90 | Fill #5

## 2018-03-21 MED FILL — LORazepam 0.5 MG TABS: 0.5 | 20 days supply | Qty: 20 | Fill #1

## 2018-03-31 DIAGNOSIS — F331 Major depressive disorder, recurrent, moderate: Secondary | ICD-10-CM | POA: Diagnosis not present

## 2018-03-31 MED FILL — MIRTAZAPINE 15 MG TABLET: 15 | 30 days supply | Qty: 30 | Fill #1

## 2018-03-31 MED FILL — ARIPiprazole 15 MG TABS: 15 | 30 days supply | Qty: 30 | Fill #0

## 2018-03-31 MED FILL — SERTRALINE HCL 50 MG TABLET: 50 | 30 days supply | Qty: 30 | Fill #1

## 2018-03-31 MED FILL — VYVANSE 50 MG CAPSULE: 50 | 30 days supply | Qty: 30 | Fill #0

## 2018-03-31 MED FILL — ZOLPIDEM TART ER 6.25 MG TA: 6.25 | 30 days supply | Qty: 30 | Fill #0

## 2018-04-08 DIAGNOSIS — J028 Acute pharyngitis due to other specified organisms: Secondary | ICD-10-CM | POA: Diagnosis not present

## 2018-04-21 DIAGNOSIS — F331 Major depressive disorder, recurrent, moderate: Secondary | ICD-10-CM | POA: Diagnosis not present

## 2018-04-21 MED FILL — DULoxetine HCL 30 MG CPEP: 30 | 30 days supply | Qty: 90 | Fill #0

## 2018-04-28 MED FILL — VYVANSE 50 MG CAPSULE: 50 | 30 days supply | Qty: 30 | Fill #0

## 2018-05-12 MED FILL — ZOLPIDEM TART ER 6.25 MG TA: 6.25 | 30 days supply | Qty: 30 | Fill #1

## 2018-05-12 MED FILL — ARIPiprazole 15 MG TABS: 15 | 30 days supply | Qty: 30 | Fill #1

## 2018-05-23 DIAGNOSIS — D333 Benign neoplasm of cranial nerves: Secondary | ICD-10-CM | POA: Diagnosis not present

## 2018-05-23 DIAGNOSIS — M3509 Sicca syndrome with other organ involvement: Secondary | ICD-10-CM | POA: Diagnosis not present

## 2018-05-23 DIAGNOSIS — Z0000 Encounter for general adult medical examination without abnormal findings: Secondary | ICD-10-CM | POA: Diagnosis not present

## 2018-05-23 DIAGNOSIS — Z1239 Encounter for other screening for malignant neoplasm of breast: Secondary | ICD-10-CM | POA: Diagnosis not present

## 2018-05-23 DIAGNOSIS — E7800 Pure hypercholesterolemia, unspecified: Secondary | ICD-10-CM | POA: Diagnosis not present

## 2018-05-23 DIAGNOSIS — Z1211 Encounter for screening for malignant neoplasm of colon: Secondary | ICD-10-CM | POA: Diagnosis not present

## 2018-05-23 DIAGNOSIS — E039 Hypothyroidism, unspecified: Secondary | ICD-10-CM | POA: Diagnosis not present

## 2018-05-23 DIAGNOSIS — F325 Major depressive disorder, single episode, in full remission: Secondary | ICD-10-CM | POA: Diagnosis not present

## 2018-05-23 MED FILL — DULoxetine HCL 30 MG CPEP: 30 | 30 days supply | Qty: 90 | Fill #1

## 2018-05-23 MED FILL — LEVOTHYROXINE 50 MCG TABLET: 50 | 90 days supply | Qty: 90 | Fill #0

## 2018-05-26 MED FILL — VYVANSE 50 MG CAPSULE: 50 | 30 days supply | Qty: 30 | Fill #0

## 2018-06-08 MED FILL — ARIPiprazole 15 MG TABS: 15 | 30 days supply | Qty: 30 | Fill #2

## 2018-06-08 MED FILL — SERTRALINE HCL 50 MG TABLET: 50 | 30 days supply | Qty: 30 | Fill #2

## 2018-06-13 MED FILL — ZOLPIDEM TART ER 6.25 MG TA: 6.25 | 30 days supply | Qty: 30 | Fill #2

## 2018-06-24 MED FILL — DULoxetine HCL 30 MG CPEP: 30 | 30 days supply | Qty: 90 | Fill #2

## 2018-06-24 MED FILL — LORazepam 0.5 MG TABS: 0.5 | 20 days supply | Qty: 20 | Fill #2

## 2018-06-29 MED FILL — VYVANSE 50 MG CAPSULE: 50 | 30 days supply | Qty: 30 | Fill #0

## 2018-07-19 MED FILL — LORazepam 0.5 MG TABS: 0.5 | 20 days supply | Qty: 20 | Fill #0

## 2018-07-19 MED FILL — ZOLPIDEM TART ER 6.25 MG TA: 6.25 | 30 days supply | Qty: 30 | Fill #0

## 2018-07-22 MED FILL — DULoxetine HCL 30 MG CPEP: 30 | 30 days supply | Qty: 90 | Fill #3

## 2018-08-08 MED FILL — ARIPiprazole 15 MG TABS: 15 | 30 days supply | Qty: 30 | Fill #3

## 2018-08-11 DIAGNOSIS — F331 Major depressive disorder, recurrent, moderate: Secondary | ICD-10-CM | POA: Diagnosis not present

## 2018-08-11 DIAGNOSIS — F908 Attention-deficit hyperactivity disorder, other type: Secondary | ICD-10-CM | POA: Diagnosis not present

## 2018-08-11 MED FILL — VYVANSE 50 MG CAPSULE: 50 | 30 days supply | Qty: 30 | Fill #0

## 2018-08-11 MED FILL — REXULTI 2 MG TABLET: 2 | 30 days supply | Qty: 30 | Fill #0

## 2018-08-12 ENCOUNTER — Other Ambulatory Visit: Payer: Self-pay | Admitting: Internal Medicine

## 2018-08-12 DIAGNOSIS — Z1231 Encounter for screening mammogram for malignant neoplasm of breast: Secondary | ICD-10-CM

## 2018-08-16 MED FILL — ZOLPIDEM TART ER 6.25 MG TA: 6.25 | 30 days supply | Qty: 30 | Fill #1

## 2018-08-16 MED FILL — ATORVASTATIN CALCIUM 20 MG: 20 | 90 days supply | Qty: 90 | Fill #0

## 2018-08-19 DIAGNOSIS — H903 Sensorineural hearing loss, bilateral: Secondary | ICD-10-CM | POA: Diagnosis not present

## 2018-08-25 MED FILL — LEVOTHYROXINE 50 MCG TABLET: 50 | 90 days supply | Qty: 90 | Fill #1

## 2018-08-26 MED FILL — DULoxetine HCL 30 MG CPEP: 30 | 30 days supply | Qty: 90 | Fill #0

## 2018-08-31 DIAGNOSIS — D333 Benign neoplasm of cranial nerves: Secondary | ICD-10-CM | POA: Diagnosis not present

## 2018-08-31 DIAGNOSIS — H903 Sensorineural hearing loss, bilateral: Secondary | ICD-10-CM | POA: Diagnosis not present

## 2018-08-31 DIAGNOSIS — H905 Unspecified sensorineural hearing loss: Secondary | ICD-10-CM | POA: Diagnosis not present

## 2018-09-07 DIAGNOSIS — H5213 Myopia, bilateral: Secondary | ICD-10-CM | POA: Diagnosis not present

## 2018-09-07 DIAGNOSIS — H35373 Puckering of macula, bilateral: Secondary | ICD-10-CM | POA: Diagnosis not present

## 2018-09-07 DIAGNOSIS — H524 Presbyopia: Secondary | ICD-10-CM | POA: Diagnosis not present

## 2018-09-07 DIAGNOSIS — H52223 Regular astigmatism, bilateral: Secondary | ICD-10-CM | POA: Diagnosis not present

## 2018-09-07 DIAGNOSIS — H2513 Age-related nuclear cataract, bilateral: Secondary | ICD-10-CM | POA: Diagnosis not present

## 2018-09-14 ENCOUNTER — Ambulatory Visit
Admission: RE | Admit: 2018-09-14 | Discharge: 2018-09-14 | Disposition: A | Discharge status: Home / Self Care | Payer: 59 | Source: Ambulatory Visit | Attending: Internal Medicine | Admitting: Internal Medicine

## 2018-09-14 DIAGNOSIS — D333 Benign neoplasm of cranial nerves: Secondary | ICD-10-CM | POA: Diagnosis not present

## 2018-09-14 DIAGNOSIS — H903 Sensorineural hearing loss, bilateral: Secondary | ICD-10-CM | POA: Diagnosis not present

## 2018-09-14 DIAGNOSIS — H905 Unspecified sensorineural hearing loss: Secondary | ICD-10-CM | POA: Diagnosis not present

## 2018-09-14 DIAGNOSIS — Z1231 Encounter for screening mammogram for malignant neoplasm of breast: Secondary | ICD-10-CM

## 2018-09-14 MED FILL — VYVANSE 50 MG CAPSULE: 50 | 30 days supply | Qty: 30 | Fill #0

## 2018-09-14 MED FILL — ZOLPIDEM TART ER 6.25 MG TA: 6.25 | 30 days supply | Qty: 30 | Fill #2

## 2018-09-14 MED FILL — OXcarbazepine 150 MG TABS: 150 | 15 days supply | Qty: 60 | Fill #1

## 2018-09-21 DIAGNOSIS — F331 Major depressive disorder, recurrent, moderate: Secondary | ICD-10-CM | POA: Diagnosis not present

## 2018-09-21 DIAGNOSIS — F908 Attention-deficit hyperactivity disorder, other type: Secondary | ICD-10-CM | POA: Diagnosis not present

## 2018-09-21 MED FILL — OXcarbazepine 300 MG TABS: 300 | 30 days supply | Qty: 90 | Fill #0

## 2018-09-21 MED FILL — REXULTI 2 MG TABLET: 2 | 30 days supply | Qty: 30 | Fill #0

## 2018-09-26 MED FILL — DULoxetine HCL 30 MG CPEP: 30 | 30 days supply | Qty: 90 | Fill #1

## 2018-09-30 DIAGNOSIS — H905 Unspecified sensorineural hearing loss: Secondary | ICD-10-CM | POA: Diagnosis not present

## 2018-09-30 DIAGNOSIS — D333 Benign neoplasm of cranial nerves: Secondary | ICD-10-CM | POA: Diagnosis not present

## 2018-09-30 DIAGNOSIS — H903 Sensorineural hearing loss, bilateral: Secondary | ICD-10-CM | POA: Diagnosis not present

## 2018-10-12 MED FILL — ZOLPIDEM TART ER 6.25 MG TA: 6.25 | 30 days supply | Qty: 30 | Fill #0

## 2018-10-12 MED FILL — VYVANSE 50 MG CAPSULE: 50 | 30 days supply | Qty: 30 | Fill #0

## 2018-10-17 MED FILL — OXcarbazepine 300 MG TABS: 300 | 30 days supply | Qty: 90 | Fill #1

## 2018-10-25 MED FILL — REXULTI 2 MG TABLET: 2 | 30 days supply | Qty: 30 | Fill #1

## 2018-10-25 MED FILL — DULoxetine HCL 30 MG CPEP: 30 | 30 days supply | Qty: 90 | Fill #2

## 2018-10-25 MED FILL — PEG-3350 SOLUTION: 420 | 2 days supply | Qty: 4000 | Fill #0

## 2018-11-11 MED FILL — OXcarbazepine 300 MG TABS: 300 | 30 days supply | Qty: 90 | Fill #2

## 2018-11-12 MED FILL — ATORVASTATIN 20 MG TABLET: 20 | 90 days supply | Qty: 90 | Fill #1

## 2018-11-12 MED FILL — ZOLPIDEM TART ER 6.25 MG TA: 6.25 | 30 days supply | Qty: 30 | Fill #1

## 2018-11-12 MED FILL — LEVOTHYROXINE 50 MCG TABLET: 50 | 90 days supply | Qty: 90 | Fill #2

## 2018-11-12 MED FILL — DULoxetine HCL 30 MG CPEP: 30 | 30 days supply | Qty: 90 | Fill #3

## 2018-11-12 MED FILL — LORazepam 0.5 MG TABS: 0.5 | 20 days supply | Qty: 20 | Fill #1

## 2018-11-12 MED FILL — REXULTI 2 MG TABLET: 2 | 30 days supply | Qty: 30 | Fill #2

## 2018-11-12 MED FILL — VYVANSE 50 MG CAPSULE: 50 | 30 days supply | Qty: 30 | Fill #0

## 2018-12-16 MED FILL — DULoxetine HCL 30 MG CPEP: 30 | 30 days supply | Qty: 90 | Fill #0

## 2018-12-16 MED FILL — REXULTI 2 MG TABLET: 2 | 30 days supply | Qty: 30 | Fill #1

## 2018-12-16 MED FILL — VYVANSE 50 MG CAPSULE: 50 | 30 days supply | Qty: 30 | Fill #0

## 2018-12-16 MED FILL — ZOLPIDEM TART ER 6.25 MG TA: 6.25 | 30 days supply | Qty: 30 | Fill #2

## 2018-12-16 MED FILL — OXcarbazepine 300 MG TABS: 300 | 30 days supply | Qty: 90 | Fill #3

## 2018-12-19 DIAGNOSIS — F331 Major depressive disorder, recurrent, moderate: Secondary | ICD-10-CM | POA: Diagnosis not present

## 2018-12-19 DIAGNOSIS — F908 Attention-deficit hyperactivity disorder, other type: Secondary | ICD-10-CM | POA: Diagnosis not present

## 2019-01-19 MED FILL — DULoxetine HCL 30 MG CPEP: 30 | 30 days supply | Qty: 90 | Fill #1

## 2019-01-20 MED FILL — REXULTI 2 MG TABLET: 2 | 30 days supply | Qty: 30 | Fill #0

## 2019-01-20 MED FILL — ZOLPIDEM TART ER 6.25 MG TA: 6.25 | 30 days supply | Qty: 30 | Fill #0

## 2019-01-23 MED FILL — VYVANSE 50 MG CAPSULE: 50 | 30 days supply | Qty: 30 | Fill #0

## 2019-02-15 MED FILL — DULoxetine HCL 30 MG CPEP: 30 | 30 days supply | Qty: 90 | Fill #2

## 2019-02-15 MED FILL — OXcarbazepine 300 MG TABS: 300 | 30 days supply | Qty: 90 | Fill #4

## 2019-02-16 MED FILL — VYVANSE 50 MG CAPSULE: 50 | 30 days supply | Qty: 30 | Fill #0

## 2019-02-16 MED FILL — ZOLPIDEM TART ER 6.25 MG TA: 6.25 | 30 days supply | Qty: 30 | Fill #1

## 2019-02-21 MED FILL — REXULTI 2 MG TABLET: 2 | 30 days supply | Qty: 30 | Fill #1

## 2019-03-07 MED FILL — LEVOTHYROXINE 50 MCG TABLET: 50 | 90 days supply | Qty: 90 | Fill #3

## 2019-03-23 MED FILL — DULoxetine HCL 30 MG CPEP: 30 | 30 days supply | Qty: 90 | Fill #3

## 2019-03-23 MED FILL — ZOLPIDEM TART ER 6.25 MG TA: 6.25 | 30 days supply | Qty: 30 | Fill #2

## 2019-03-23 MED FILL — REXULTI 2 MG TABLET: 2 | 30 days supply | Qty: 30 | Fill #2

## 2019-03-25 MED FILL — VYVANSE 50 MG CAPSULE: 50 | 30 days supply | Qty: 30 | Fill #0

## 2019-03-26 ENCOUNTER — Encounter (HOSPITAL_COMMUNITY): Payer: Self-pay | Admitting: *Deleted

## 2019-03-26 ENCOUNTER — Emergency Department (HOSPITAL_COMMUNITY): Payer: 59

## 2019-03-26 ENCOUNTER — Other Ambulatory Visit: Payer: Self-pay

## 2019-03-26 ENCOUNTER — Emergency Department (HOSPITAL_COMMUNITY)
Admission: EM | Admit: 2019-03-26 | Discharge: 2019-03-26 | Disposition: A | Discharge status: Home / Self Care | Payer: 59 | Attending: Emergency Medicine | Admitting: Emergency Medicine

## 2019-03-26 DIAGNOSIS — W1830XA Fall on same level, unspecified, initial encounter: Secondary | ICD-10-CM | POA: Diagnosis not present

## 2019-03-26 DIAGNOSIS — R51 Headache: Secondary | ICD-10-CM | POA: Insufficient documentation

## 2019-03-26 DIAGNOSIS — Y999 Unspecified external cause status: Secondary | ICD-10-CM | POA: Diagnosis not present

## 2019-03-26 DIAGNOSIS — R42 Dizziness and giddiness: Secondary | ICD-10-CM | POA: Insufficient documentation

## 2019-03-26 DIAGNOSIS — Y939 Activity, unspecified: Secondary | ICD-10-CM | POA: Insufficient documentation

## 2019-03-26 DIAGNOSIS — E039 Hypothyroidism, unspecified: Secondary | ICD-10-CM | POA: Diagnosis not present

## 2019-03-26 DIAGNOSIS — S0011XA Contusion of right eyelid and periocular area, initial encounter: Secondary | ICD-10-CM | POA: Diagnosis not present

## 2019-03-26 DIAGNOSIS — S060X0A Concussion without loss of consciousness, initial encounter: Secondary | ICD-10-CM | POA: Insufficient documentation

## 2019-03-26 DIAGNOSIS — S0990XA Unspecified injury of head, initial encounter: Secondary | ICD-10-CM

## 2019-03-26 DIAGNOSIS — Z79899 Other long term (current) drug therapy: Secondary | ICD-10-CM | POA: Insufficient documentation

## 2019-03-26 DIAGNOSIS — Y929 Unspecified place or not applicable: Secondary | ICD-10-CM | POA: Diagnosis not present

## 2019-03-26 NOTE — ED Notes (Signed)
Patient verbalized understanding of discharge instructions. Opportunities for questioning and answers were provided. Armband removed by staff. Patient removed from ED.   

## 2019-03-26 NOTE — ED Triage Notes (Signed)
The pt fell on Friday and struck her forehead on a wooden bridge  Dizziness since then  She has a brain shunt   For 4 years.  She was at   [employer] and waitged until she came home nio vomiting    Black lt eye

## 2019-03-26 NOTE — ED Provider Notes (Signed)
[facility] EMERGENCY DEPARTMENT Provider Note   CSN: [id number] Arrival date & time: 03/26/19  1457    History   Chief Complaint Chief Complaint  Patient presents with  . Fall    HPI ILONA COLLEY is a 63 y.o. female.     Patient with history of ulcer, depression, shunt placed 4 years ago presents for evaluation of head injury.  This happened Friday evening she slipped and fell hard on her forehead and left eye area.  Patient started feeling lightheaded worsening today.  No focal neuro deficits no blood thinners.     Past Medical History:  Diagnosis Date  . Gastric ulcer   . Insomnia   . 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 Pure hypercholesterolemia   . Syncope and collapse   . Thyroid disease    hypothyroidism  . Unspecified menopausal and postmenopausal disorder     There are no active problems to display for this patient.   Past Surgical History:  Procedure Laterality Date  . BREAST BIOPSY Right 2017  . BUNIONECTOMY Bilateral   . CHOLECYSTECTOMY    . COLONOSCOPY    . colonso    . complete hysterectomy    . ESOPHAGOGASTRODUODENOSCOPY    . KNEE ARTHROSCOPY Right      OB History   No obstetric history on file.      Home Medications    Prior to Admission medications   Medication Sig Start Date End Date Taking? Authorizing Provider  amoxicillin-clavulanate (AUGMENTIN) 875-125 MG tablet Take 1 tablet by mouth 2 (two) times daily. 12/31/16   Brunetta Jeans, PA-C  ARIPiprazole (ABILIFY) 5 MG tablet Take 5 mg by mouth daily.    [provider]  DULoxetine (CYMBALTA) 30 MG capsule Take 90 mg by mouth daily.     [provider]  estradiol (VIVELLE-DOT) 0.05 MG/24HR patch Place 1 patch onto the skin 2 (two) times a week.    [provider]  ibuprofen (ADVIL,MOTRIN) 200 MG tablet Take 600 mg by mouth every 6 (six) hours as needed for mild pain.    [provider]   levothyroxine (SYNTHROID, LEVOTHROID) 50 MCG tablet Take 50 mcg by mouth daily before breakfast.    [provider]  lisdexamfetamine (VYVANSE) 70 MG capsule Take 70 mg by mouth daily.    [provider]  Magnesium 500 MG CAPS Take 500 mg by mouth daily.    [provider]  Multiple Vitamins-Minerals (MULTIVITAMIN WITH MINERALS) tablet Take 1 tablet by mouth daily.    [provider]  zolpidem (AMBIEN) 10 MG tablet Take 10 mg by mouth at bedtime as needed for sleep. Lorrin Mais    [provider]    Family History Family History  Problem Relation Age of Onset  . Cancer - Lung Brother   . Breast cancer Neg Hx     Social History Social History   Tobacco Use  . Smoking status: Never Smoker  . Smokeless tobacco: Never Used  Substance Use Topics  . Alcohol use: Not on file  . Drug use: Not on file     Allergies   Patient has no known allergies.   Review of Systems Review of Systems  Constitutional: Negative for chills and fever.  HENT: Negative for congestion.   Eyes: Negative for visual disturbance.  Respiratory: Negative for shortness of breath.   Cardiovascular: Negative for chest pain.  Gastrointestinal: Negative for abdominal pain and vomiting.  Genitourinary: Negative for flank pain.  Musculoskeletal: Negative for back pain, neck pain and neck stiffness.  Skin: Negative for rash.  Neurological: Positive for light-headedness and headaches. Negative for seizures, weakness and numbness.     Physical Exam Updated Vital Signs BP 128/68 (BP Location: Left Arm)   Pulse 81   Temp 98.3 F (36.8 C) (Oral)   Resp 16   Ht 5\' 4"  (1.626 m)   Wt 70.3 kg   SpO2 100%   BMI 26.61 kg/m   Physical Exam Vitals signs and nursing note reviewed.  Constitutional:      Appearance: She is well-developed.  HENT:     Head: Normocephalic.     Comments: ecchymosis periorbital left eye Eyes:     General:        Right eye: No discharge.         Left eye: No discharge.     Conjunctiva/sclera: Conjunctivae normal.  Neck:     Musculoskeletal: Normal range of motion and neck supple.     Trachea: No tracheal deviation.  Cardiovascular:     Rate and Rhythm: Normal rate.  Pulmonary:     Effort: Pulmonary effort is normal.     Breath sounds: Normal breath sounds.  Abdominal:     General: There is no distension.     Palpations: Abdomen is soft.     Tenderness: There is no abdominal tenderness. There is no guarding.  Skin:    General: Skin is warm.     Findings: No rash.  Neurological:     Mental Status: She is alert and oriented to person, place, and time.     GCS: GCS eye subscore is 4. GCS verbal subscore is 5. GCS motor subscore is 6.     Comments: 5+ strength in UE and LE with f/e at major joints. Sensation to palpation intact in UE and LE. CNs 2-12 grossly intact.  EOMFI.  PERRL.   Finger nose and coordination intact bilateral.   Visual fields intact to finger testing. No nystagmus       ED Treatments / Results  Labs (all labs ordered are listed, but only abnormal results are displayed) Labs Reviewed - No data to display  EKG None  Radiology No results found.  Procedures Procedures (including critical care time)  Medications Ordered in ED Medications - No data to display   Initial Impression / Assessment and Plan / ED Course  I have reviewed the triage vital signs and the nursing notes.  Pertinent labs & imaging results that were available during my care of the patient were reviewed by me and considered in my medical decision making (see chart for details).       Patient presents for assessment of head injury and worsening symptoms today.  With history of shunt and worsening symptoms we did discuss CT scan.  Patient comfortable this plan.   No focal neuro deficits.  Vital signs unremarkable.  Well-appearing on reassessment.  Reviewed CT scan results and mild change in ventricle size compared to  previous.  Patient has normal neurologic exam no significant symptoms.  Discussed with neurosurgery on-call who recommended outpatient follow-up this week with her neurosurgeon for programming/reassessment.  Final Clinical Impressions(s) / ED Diagnoses   Final diagnoses:  Acute head injury, initial encounter  Concussion without loss of consciousness, initial encounter    ED Discharge Orders    None       Elnora Morrison, MD 03/26/19 2116

## 2019-03-26 NOTE — Discharge Instructions (Signed)
Stay well-hydrated. Use Tylenol as needed for headache. Return for confusion, seizure-like activity, unilateral numbness or weakness.

## 2019-03-26 NOTE — ED Notes (Signed)
Updated pt on wait for treatment room.  Sent EDP a message to see if they want a Head CT now or wait for evaluation.

## 2019-03-26 NOTE — ED Notes (Signed)
Patient transported to CT 

## 2019-03-28 MED FILL — OXcarbazepine 300 MG TABS: 300 | 30 days supply | Qty: 90 | Fill #5

## 2019-04-13 ENCOUNTER — Telehealth: Payer: 59 | Admitting: Physician Assistant

## 2019-04-13 DIAGNOSIS — J329 Chronic sinusitis, unspecified: Secondary | ICD-10-CM

## 2019-04-13 MED ORDER — AMOXICILLIN-POT CLAVULANATE 875-125 MG PO TABS: 1.0000 | ORAL_TABLET | Freq: Two times a day (BID) | ORAL | 0 refills | Status: AC

## 2019-04-13 MED FILL — AMOX-CLAV 875-125 MG TABLET: 875-125 | 7 days supply | Qty: 14 | Fill #0

## 2019-04-13 NOTE — Progress Notes (Signed)
We are sorry that you are not feeling well.  Here is how we plan to help!  Based on what you have shared with me it looks like you have sinusitis.  Sinusitis is inflammation and infection in the sinus cavities of the head.  Based on your presentation I believe you most likely have Acute Bacterial Sinusitis.  This is an infection caused by bacteria and is treated with antibiotics. I have prescribed Augmentin 875mg/125mg one tablet twice daily with food, for 7 days. You may use an oral decongestant such as Mucinex D or if you have glaucoma or high blood pressure use plain Mucinex. Saline nasal spray help and can safely be used as often as needed for congestion.  If you develop worsening sinus pain, fever or notice severe headache and vision changes, or if symptoms are not better after completion of antibiotic, please schedule an appointment with a health care provider.    Sinus infections are not as easily transmitted as other respiratory infection, however we still recommend that you avoid close contact with loved ones, especially the very young and elderly.  Remember to wash your hands thoroughly throughout the day as this is the number one way to prevent the spread of infection!  Home Care:  Only take medications as instructed by your medical team.  Complete the entire course of an antibiotic.  Do not take these medications with alcohol.  A steam or ultrasonic humidifier can help congestion.  You can place a towel over your head and breathe in the steam from hot water coming from a faucet.  Avoid close contacts especially the very young and the elderly.  Cover your mouth when you cough or sneeze.  Always remember to wash your hands.  Get Help Right Away If:  You develop worsening fever or sinus pain.  You develop a severe head ache or visual changes.  Your symptoms persist after you have completed your treatment plan.  Make sure you  Understand these instructions.  Will watch your  condition.  Will get help right away if you are not doing well or get worse.  Your e-visit answers were reviewed by a board certified advanced clinical practitioner to complete your personal care plan.  Depending on the condition, your plan could have included both over the counter or prescription medications.  If there is a problem please reply  once you have received a response from your provider.  Your safety is important to us.  If you have drug allergies check your prescription carefully.    You can use MyChart to ask questions about today's visit, request a non-urgent call back, or ask for a work or school excuse for 24 hours related to this e-Visit. If it has been greater than 24 hours you will need to follow up with your provider, or enter a new e-Visit to address those concerns.  You will get an e-mail in the next two days asking about your experience.  I hope that your e-visit has been valuable and will speed your recovery. Thank you for using e-visits.    

## 2019-04-24 MED FILL — REXULTI 2 MG TABLET: 2 | 30 days supply | Qty: 30 | Fill #0

## 2019-04-24 MED FILL — OXcarbazepine 300 MG TABS: 300 | 30 days supply | Qty: 90 | Fill #6

## 2019-04-24 MED FILL — DULoxetine HCL 30 MG CPEP: 30 | 30 days supply | Qty: 90 | Fill #0

## 2019-04-24 MED FILL — ZOLPIDEM TART ER 6.25 MG TA: 6.25 | 30 days supply | Qty: 30 | Fill #3

## 2019-05-04 MED FILL — LORazepam 0.5 MG TABS: 0.5 | 20 days supply | Qty: 20 | Fill #0

## 2019-05-06 MED FILL — VYVANSE 50 MG CAPSULE: 50 | 30 days supply | Qty: 30 | Fill #0

## 2019-05-27 MED FILL — DULoxetine HCL 30 MG CPEP: 30 | 30 days supply | Qty: 90 | Fill #0

## 2019-05-27 MED FILL — REXULTI 2 MG TABLET: 2 | 30 days supply | Qty: 30 | Fill #1

## 2019-05-27 MED FILL — ZOLPIDEM TART ER 6.25 MG TA: 6.25 | 30 days supply | Qty: 30 | Fill #0

## 2019-05-31 MED FILL — OXcarbazepine 300 MG TABS: 300 | 30 days supply | Qty: 90 | Fill #7

## 2019-06-02 DIAGNOSIS — G500 Trigeminal neuralgia: Secondary | ICD-10-CM | POA: Diagnosis not present

## 2019-06-02 DIAGNOSIS — M6281 Muscle weakness (generalized): Secondary | ICD-10-CM | POA: Diagnosis not present

## 2019-06-02 DIAGNOSIS — Z982 Presence of cerebrospinal fluid drainage device: Secondary | ICD-10-CM | POA: Diagnosis not present

## 2019-06-02 DIAGNOSIS — D333 Benign neoplasm of cranial nerves: Secondary | ICD-10-CM | POA: Diagnosis not present

## 2019-06-02 DIAGNOSIS — Z4541 Encounter for adjustment and management of cerebrospinal fluid drainage device: Secondary | ICD-10-CM | POA: Diagnosis not present

## 2019-06-05 DIAGNOSIS — D333 Benign neoplasm of cranial nerves: Secondary | ICD-10-CM | POA: Diagnosis not present

## 2019-06-05 DIAGNOSIS — G500 Trigeminal neuralgia: Secondary | ICD-10-CM | POA: Diagnosis not present

## 2019-06-08 ENCOUNTER — Telehealth: Payer: 59 | Admitting: Physician Assistant

## 2019-06-08 DIAGNOSIS — H1030 Unspecified acute conjunctivitis, unspecified eye: Secondary | ICD-10-CM | POA: Diagnosis not present

## 2019-06-08 MED ORDER — POLYMYXIN B-TRIMETHOPRIM 10000-0.1 UNIT/ML-% OP SOLN: 2.0000 [drp] | OPHTHALMIC | 0 refills | Status: DC

## 2019-06-08 MED FILL — VYVANSE 50 MG CAPSULE: 50 | 30 days supply | Qty: 30 | Fill #0

## 2019-06-08 MED FILL — POLYMYXIN B/TMP EYE DROPS: 10000-0.1 | 7 days supply | Qty: 10 | Fill #0

## 2019-06-08 NOTE — Progress Notes (Signed)

## 2019-06-27 MED FILL — ZOLPIDEM TART ER 6.25 MG TA: 6.25 | 30 days supply | Qty: 30 | Fill #0

## 2019-06-27 MED FILL — DULoxetine HCL 30 MG CPEP: 30 | 30 days supply | Qty: 90 | Fill #0

## 2019-06-27 MED FILL — REXULTI 2 MG TABLET: 2 | 30 days supply | Qty: 30 | Fill #0

## 2019-07-03 MED FILL — LEVOTHYROXINE 50 MCG TABLET: 50 | 90 days supply | Qty: 90 | Fill #0

## 2019-07-04 MED FILL — OXcarbazepine 300 MG TABS: 300 | 30 days supply | Qty: 90 | Fill #8

## 2019-07-10 MED FILL — VYVANSE 50 MG CAPSULE: 50 | 30 days supply | Qty: 30 | Fill #0

## 2019-07-13 DIAGNOSIS — F908 Attention-deficit hyperactivity disorder, other type: Secondary | ICD-10-CM | POA: Diagnosis not present

## 2019-07-13 DIAGNOSIS — F331 Major depressive disorder, recurrent, moderate: Secondary | ICD-10-CM | POA: Diagnosis not present

## 2019-07-27 MED FILL — DULoxetine HCL 30 MG CPEP: 30 | 30 days supply | Qty: 90 | Fill #1

## 2019-07-27 MED FILL — REXULTI 2 MG TABLET: 2 | 30 days supply | Qty: 30 | Fill #2

## 2019-07-27 MED FILL — ZOLPIDEM TART ER 6.25 MG TA: 6.25 | 30 days supply | Qty: 30 | Fill #0

## 2019-08-01 ENCOUNTER — Ambulatory Visit: Payer: 59 | Attending: Internal Medicine

## 2019-08-01 DIAGNOSIS — Z20828 Contact with and (suspected) exposure to other viral communicable diseases: Secondary | ICD-10-CM | POA: Diagnosis not present

## 2019-08-01 DIAGNOSIS — Z20822 Contact with and (suspected) exposure to covid-19: Secondary | ICD-10-CM

## 2019-08-03 LAB — NOVEL CORONAVIRUS, NAA: SARS-CoV-2, NAA: NOT DETECTED

## 2019-08-10 MED FILL — VYVANSE 50 MG CAPSULE: 50 | 30 days supply | Qty: 30 | Fill #0

## 2019-08-16 ENCOUNTER — Other Ambulatory Visit: Payer: 59

## 2019-08-21 MED FILL — OXcarbazepine 300 MG TABS: 300 | 30 days supply | Qty: 90 | Fill #9

## 2019-08-25 ENCOUNTER — Other Ambulatory Visit (HOSPITAL_COMMUNITY): Payer: Self-pay | Admitting: Psychiatry

## 2019-08-25 DIAGNOSIS — G500 Trigeminal neuralgia: Secondary | ICD-10-CM | POA: Diagnosis not present

## 2019-08-25 MED FILL — OXCARBAZEPINE 300 MG/5 ML S: 300 | 2 days supply | Qty: 40 | Fill #0

## 2019-08-26 MED FILL — ZOLPIDEM TART ER 6.25 MG TA: 6.25 | 30 days supply | Qty: 30 | Fill #1

## 2019-08-26 MED FILL — DULoxetine HCL 30 MG CPEP: 30 | 30 days supply | Qty: 90 | Fill #2

## 2019-08-28 MED FILL — clonazePAM 0.25 MG TBDP: 0.25 | 8 days supply | Qty: 60 | Fill #0

## 2019-08-30 MED FILL — REXULTI 2 MG TABLET: 2 | 30 days supply | Qty: 30 | Fill #0

## 2019-09-09 MED FILL — VYVANSE 50 MG CAPSULE: 50 | 30 days supply | Qty: 30 | Fill #0

## 2019-09-22 MED FILL — ZOLPIDEM TART ER 6.25 MG TA: 6.25 | 30 days supply | Qty: 30 | Fill #2

## 2019-09-25 MED FILL — LORazepam 0.5 MG TABS: 0.5 | 20 days supply | Qty: 20 | Fill #0

## 2019-10-02 MED FILL — REXULTI 2 MG TABLET: 2 | 30 days supply | Qty: 30 | Fill #1

## 2019-10-02 MED FILL — OXCARBAZEPINE 300 MG/5 ML S: 300 | 2 days supply | Qty: 40 | Fill #1

## 2019-10-02 MED FILL — OXcarbazepine 300 MG TABS: 300 | 30 days supply | Qty: 90 | Fill #0

## 2019-10-02 MED FILL — DULoxetine HCL 30 MG CPEP: 30 | 30 days supply | Qty: 90 | Fill #3

## 2019-10-04 MED FILL — LEVOTHYROXINE 50 MCG TABLET: 50 | 90 days supply | Qty: 90 | Fill #1

## 2019-10-07 MED FILL — VYVANSE 50 MG CAPSULE: 50 | 30 days supply | Qty: 30 | Fill #0

## 2019-10-25 MED FILL — ZOLPIDEM TART ER 6.25 MG TA: 6.25 | 30 days supply | Qty: 30 | Fill #3

## 2019-11-02 MED FILL — REXULTI 2 MG TABLET: 2 | 30 days supply | Qty: 30 | Fill #2

## 2019-11-02 MED FILL — DULoxetine HCL 30 MG CPEP: 30 | 30 days supply | Qty: 90 | Fill #0

## 2019-11-09 MED FILL — VYVANSE 50 MG CAPSULE: 50 | 30 days supply | Qty: 30 | Fill #0

## 2019-11-23 MED FILL — ZOLPIDEM TART ER 6.25 MG TA: 6.25 | 30 days supply | Qty: 30 | Fill #4

## 2019-11-29 DIAGNOSIS — Z79899 Other long term (current) drug therapy: Secondary | ICD-10-CM | POA: Diagnosis not present

## 2019-12-02 MED FILL — REXULTI 2 MG TABLET: 2 | 30 days supply | Qty: 30 | Fill #3

## 2019-12-02 MED FILL — DULoxetine HCL 30 MG CPEP: 30 | 30 days supply | Qty: 90 | Fill #1

## 2019-12-04 DIAGNOSIS — F331 Major depressive disorder, recurrent, moderate: Secondary | ICD-10-CM | POA: Diagnosis not present

## 2019-12-04 DIAGNOSIS — F908 Attention-deficit hyperactivity disorder, other type: Secondary | ICD-10-CM | POA: Diagnosis not present

## 2019-12-06 MED FILL — ATORVASTATIN 20 MG TABLET: 20 | 90 days supply | Qty: 90 | Fill #0

## 2019-12-07 MED FILL — VYVANSE 50 MG CAPSULE: 50 | 30 days supply | Qty: 30 | Fill #0

## 2019-12-14 DIAGNOSIS — Z1159 Encounter for screening for other viral diseases: Secondary | ICD-10-CM | POA: Diagnosis not present

## 2019-12-19 DIAGNOSIS — Z8371 Family history of colonic polyps: Secondary | ICD-10-CM | POA: Diagnosis not present

## 2019-12-19 DIAGNOSIS — Z800 Family history of malignant neoplasm of digestive organs: Secondary | ICD-10-CM | POA: Diagnosis not present

## 2019-12-19 DIAGNOSIS — Z1211 Encounter for screening for malignant neoplasm of colon: Secondary | ICD-10-CM | POA: Diagnosis not present

## 2019-12-27 MED FILL — OXcarbazepine 300 MG TABS: 300 | 30 days supply | Qty: 90 | Fill #1

## 2020-01-03 MED FILL — DULoxetine HCL 30 MG CPEP: 30 | 30 days supply | Qty: 90 | Fill #2

## 2020-01-03 MED FILL — REXULTI 2 MG TABLET: 2 | 30 days supply | Qty: 30 | Fill #4

## 2020-01-05 MED FILL — LEVOTHYROXINE 50 MCG TABLET: 50 | 90 days supply | Qty: 90 | Fill #2

## 2020-01-05 MED FILL — VYVANSE 50 MG CAPSULE: 50 | 30 days supply | Qty: 30 | Fill #0

## 2020-01-22 MED FILL — ZOLPIDEM TART ER 6.25 MG TA: 6.25 | 30 days supply | Qty: 30 | Fill #1

## 2020-02-03 MED FILL — DULoxetine HCL 30 MG CPEP: 30 | 30 days supply | Qty: 90 | Fill #3

## 2020-02-06 MED FILL — VYVANSE 50 MG CAPSULE: 50 | 30 days supply | Qty: 30 | Fill #0

## 2020-02-19 MED FILL — ZOLPIDEM TART ER 6.25 MG TA: 6.25 | 30 days supply | Qty: 30 | Fill #2

## 2020-05-21 MED FILL — OXCARBAZEPINE 300 MG/5 ML S: 300 | 2 days supply | Qty: 40 | Fill #2

## 2020-06-17 MED FILL — OXCARBAZEPINE 300 MG/5 ML S: 300 | 2 days supply | Qty: 40 | Fill #3

## 2020-08-07 ENCOUNTER — Telehealth: Payer: 59 | Admitting: Family

## 2020-08-07 DIAGNOSIS — J019 Acute sinusitis, unspecified: Secondary | ICD-10-CM

## 2020-08-07 MED ORDER — AMOXICILLIN-POT CLAVULANATE 875-125 MG PO TABS: 1.0000 | ORAL_TABLET | Freq: Two times a day (BID) | ORAL | 0 refills | Status: DC

## 2020-08-07 NOTE — Progress Notes (Signed)

## 2020-08-14 MED FILL — OXCARBAZEPINE 300 MG/5 ML S: 300 | 2 days supply | Qty: 40 | Fill #4

## 2020-10-02 ENCOUNTER — Ambulatory Visit (INDEPENDENT_AMBULATORY_CARE_PROVIDER_SITE_OTHER): Payer: Medicare HMO

## 2020-10-02 ENCOUNTER — Other Ambulatory Visit: Payer: Self-pay

## 2020-10-02 ENCOUNTER — Ambulatory Visit: Payer: Medicare HMO | Admitting: Orthopaedic Surgery

## 2020-10-02 DIAGNOSIS — Z96641 Presence of right artificial hip joint: Secondary | ICD-10-CM

## 2020-10-02 DIAGNOSIS — M25551 Pain in right hip: Secondary | ICD-10-CM | POA: Diagnosis not present

## 2020-10-02 DIAGNOSIS — M4807 Spinal stenosis, lumbosacral region: Secondary | ICD-10-CM

## 2020-10-02 NOTE — Progress Notes (Signed)
Office Visit Note   Patient: Latoya Garcia           Date of Birth: 01-Aug-1956           MRN: [id number] Visit Date: 10/02/2020              Requested by: Leeroy Cha, MD [address] [place],  [place] [address] PCP: Leeroy Cha, MD   Assessment & Plan: Visit Diagnoses:  1. Pain in right hip     Plan: Given the severity of her right sided low back pain and hip pain, a MRI at this point is warranted above the lumbar spine to rule out any type of nerve compression of the right side an MRI of the right hip to rule out any internal derangement.  I am concerned given how severe this pain gets to the point where it does not allow her to walk.  She agrees with this treatment plan.  We will see her back after these MRIs.  Follow-Up Instructions: No follow-ups on file.   Orders:  Orders Placed This Encounter  Procedures  . XR HIP UNILAT W OR W/O PELVIS 1V RIGHT   No orders of the defined types were placed in this encounter.     Procedures: No procedures performed   Clinical Data: No additional findings.   Subjective: Chief Complaint  Patient presents with  . 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  He has since deceased.  She has moved to [place] recently but came to me for further ration treatment of severe right hip pain.  She has had 2 episodes of the pain becoming so debilitated that she had to get around in a wheelchair.  She denies any pain today but she has had pain on the lateral aspect of her hip and around her low back on the right side as well.  She denies any groin pain.  This is been concerning for her and she went to an orthopedic urgent care clinic because of this.  Sometimes it goes from no pain at all to severe 10 out of 10 pain when these episodes do occur.  If she does walk long distances her pain gets quite significantly bad.  She has been on  exercise Peloton bike and that even made the pain increase.  She denies any radicular symptoms going down her leg or any weakness in her legs.  She is not a diabetic.  She is thin and active.  She has no other acute medical issues.  HPI  Review of Systems She currently denies any headache, chest pain, shortness of breath, fever, chills, nausea, vomiting  Objective: Vital Signs: There were no vitals taken for this visit.  Physical Exam She is alert and orient x3 and in no acute distress Ortho Exam Examination of her right hip and back today are entirely normal. Specialty Comments:  No specialty comments available.  Imaging: XR HIP UNILAT W OR W/O PELVIS 1V RIGHT  Result Date: 10/02/2020 An AP pelvis and lateral right hip shows no acute findings.    PMFS History: There are no problems to display for this patient.  Past Medical History:  Diagnosis Date  . Gastric ulcer   . Insomnia   . 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 Pure hypercholesterolemia   . Syncope and collapse   . Thyroid disease  hypothyroidism  . Unspecified menopausal and postmenopausal disorder     Family History  Problem Relation Age of Onset  . Cancer - Lung Brother   . Breast cancer Neg Hx     Past Surgical History:  Procedure Laterality Date  . BREAST BIOPSY Right 2017  . BUNIONECTOMY Bilateral   . CHOLECYSTECTOMY    . COLONOSCOPY    . colonso    . complete hysterectomy    . ESOPHAGOGASTRODUODENOSCOPY    . KNEE ARTHROSCOPY Right    Social History   Occupational History  . Not on file  Tobacco Use  . Smoking status: Never Smoker  . Smokeless tobacco: Never Used  Substance and Sexual Activity  . Alcohol use: Not on file  . Drug use: Not on file  . Sexual activity: Not on file

## 2020-10-22 ENCOUNTER — Other Ambulatory Visit: Payer: Medicare HMO

## 2020-10-24 ENCOUNTER — Telehealth: Payer: Medicare HMO | Admitting: Emergency Medicine

## 2020-10-24 DIAGNOSIS — H1031 Unspecified acute conjunctivitis, right eye: Secondary | ICD-10-CM

## 2020-10-24 MED ORDER — OFLOXACIN 0.3 % OP SOLN: OPHTHALMIC | 0 refills | Status: DC

## 2020-10-24 NOTE — Progress Notes (Signed)
E-Visit for Pink Eye   We are sorry that you are not feeling well.  Here is how we plan to help!  Based on what you have shared with me it looks like you have conjunctivitis.  Conjunctivitis is a common inflammatory or infectious condition of the eye that is often referred to as "pink eye".  In most cases it is contagious (viral or bacterial). However, not all conjunctivitis requires antibiotics (ex. Allergic).  We have made appropriate suggestions for you based upon your presentation.  I have prescribed Oflaxacin 1-2 drops 4 times a day times 5 days   Pink eye can be highly contagious.  It is typically spread through direct contact with secretions, or contaminated objects or surfaces that one may have touched.  Strict handwashing is suggested with soap and water is urged.  If not available, use alcohol based had sanitizer.  Avoid unnecessary touching of the eye.  If you wear contact lenses, you will need to refrain from wearing them until you see no white discharge from the eye for at least 24 hours after being on medication.  You should see symptom improvement in 1-2 days after starting the medication regimen.  Call us if symptoms are not improved in 1-2 days.  Home Care:  Wash your hands often!  Do not wear your contacts until you complete your treatment plan.  Avoid sharing towels, bed linen, personal items with a person who has pink eye.  See attention for anyone in your home with similar symptoms.  Get Help Right Away If:  Your symptoms do not improve.  You develop blurred or loss of vision.  Your symptoms worsen (increased discharge, pain or redness)  Your e-visit answers were reviewed by a board certified advanced clinical practitioner to complete your personal care plan.  Depending on the condition, your plan could have included both over the counter or prescription medications.  If there is a problem please reply  once you have received a response from your provider.  Your  safety is important to us.  If you have drug allergies check your prescription carefully.    You can use MyChart to ask questions about today's visit, request a non-urgent call back, or ask for a work or school excuse for 24 hours related to this e-Visit. If it has been greater than 24 hours you will need to follow up with your provider, or enter a new e-Visit to address those concerns.   You will get an e-mail in the next two days asking about your experience.  I hope that your e-visit has been valuable and will speed your recovery. Thank you for using e-visits.   Approximately 5 minutes was spent documenting and reviewing patient's chart.    

## 2020-11-12 ENCOUNTER — Telehealth: Payer: Self-pay | Admitting: Orthopaedic Surgery

## 2020-11-12 NOTE — Telephone Encounter (Signed)
Called left pt vm to call and set MRI review appt with Dr. Ninfa Linden after 4/15. Will try again another time

## 2020-11-13 ENCOUNTER — Telehealth: Payer: Self-pay | Admitting: Orthopaedic Surgery

## 2020-11-13 NOTE — Telephone Encounter (Signed)
Called pt 2X left vm to call n set appt for Dr. Ninfa Linden MRI review after 11/22/20.

## 2020-11-15 ENCOUNTER — Telehealth: Payer: Self-pay | Admitting: Orthopaedic Surgery

## 2020-11-15 NOTE — Telephone Encounter (Signed)
Called pt 3X and no answer. Left vm. 3rd attempt.

## 2020-11-20 ENCOUNTER — Other Ambulatory Visit: Payer: Medicare HMO

## 2020-11-21 ENCOUNTER — Telehealth: Payer: Self-pay | Admitting: Orthopaedic Surgery

## 2020-11-21 ENCOUNTER — Other Ambulatory Visit: Payer: Self-pay

## 2020-11-21 ENCOUNTER — Ambulatory Visit
Admission: RE | Admit: 2020-11-21 | Discharge: 2020-11-21 | Disposition: A | Discharge status: Home / Self Care | Payer: Medicare HMO | Source: Ambulatory Visit | Attending: Orthopaedic Surgery | Admitting: Orthopaedic Surgery

## 2020-11-21 DIAGNOSIS — G912 (Idiopathic) normal pressure hydrocephalus: Secondary | ICD-10-CM | POA: Insufficient documentation

## 2020-11-21 DIAGNOSIS — Z96641 Presence of right artificial hip joint: Secondary | ICD-10-CM

## 2020-11-21 DIAGNOSIS — Z982 Presence of cerebrospinal fluid drainage device: Secondary | ICD-10-CM | POA: Insufficient documentation

## 2020-11-21 DIAGNOSIS — M4807 Spinal stenosis, lumbosacral region: Secondary | ICD-10-CM

## 2020-11-21 IMAGING — MR MR LUMBAR SPINE W/O CM
4 of 5 series · 27 of 48 positions shown · non-contrast
Comparison: None.

CLINICAL DATA: Low back pain radiating to the right lower extremity
for 6 months

EXAM:
MRI LUMBAR SPINE WITHOUT CONTRAST
TECHNIQUE: Multiplanar, multisequence MR imaging of the lumbar spine was
performed. No intravenous contrast was administered.

[Series 3: T2 · sagittal · 4.0mm · 1.09mm/px · 7 of 17 slices shown (1 of 2)]
[im 1/17]
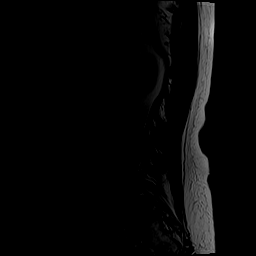
[im 3/17]
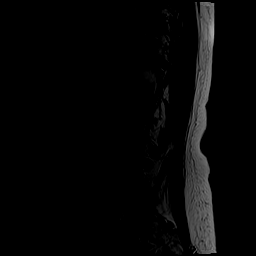
[im 6/17]
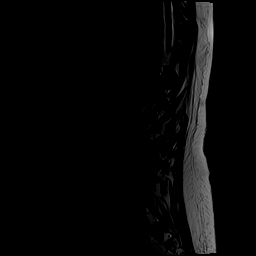
[im 9/17]
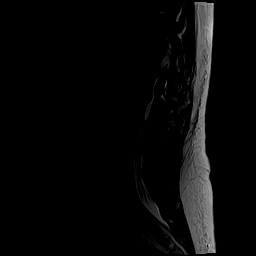
[im 11/17]
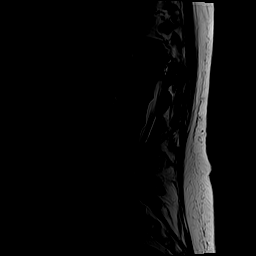
[im 14/17]
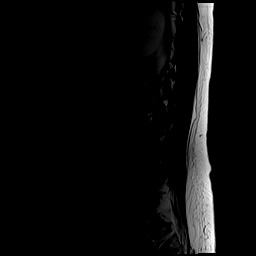
[im 17/17]
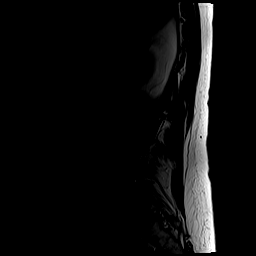

[Series 5: T1 · sagittal · 4.0mm · 1.09mm/px · 6 of 17 slices shown (1 of 2)]
[im 1/17]
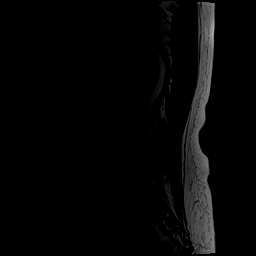
[im 4/17]
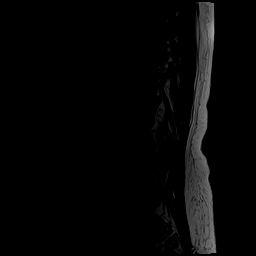
[im 7/17]
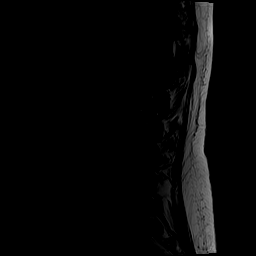
[im 10/17]
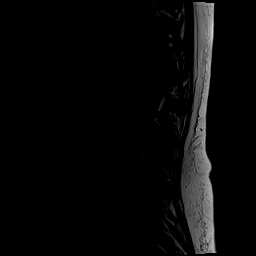
[im 13/17]
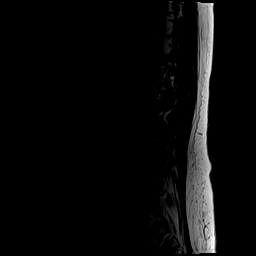
[im 17/17]
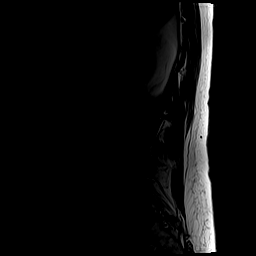

[Series 6: T2 · axial · 4.0mm · 0.39mm/px · z∈[+4,+197]mm · 8 of 38 slices shown (2 of 2)]
[im 1/38]
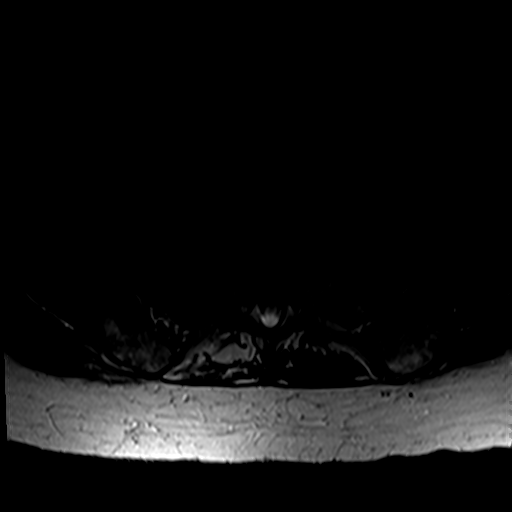
[im 6/38]
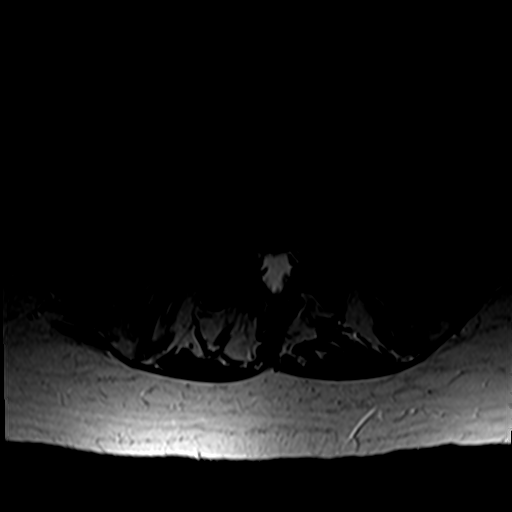
[im 12/38]
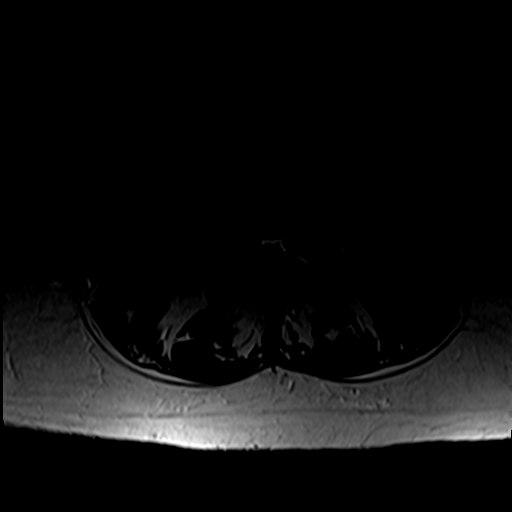
[im 18/38]
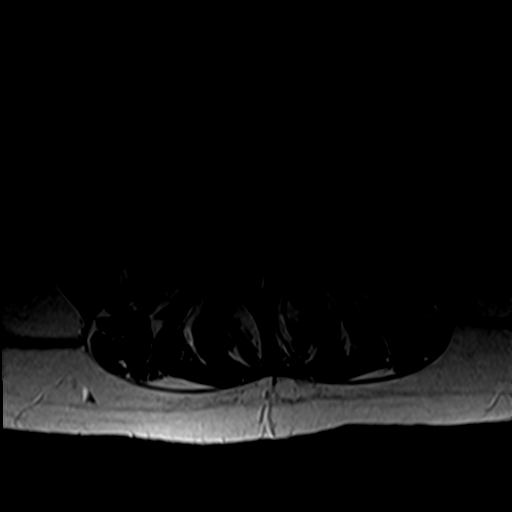
[im 20/38]
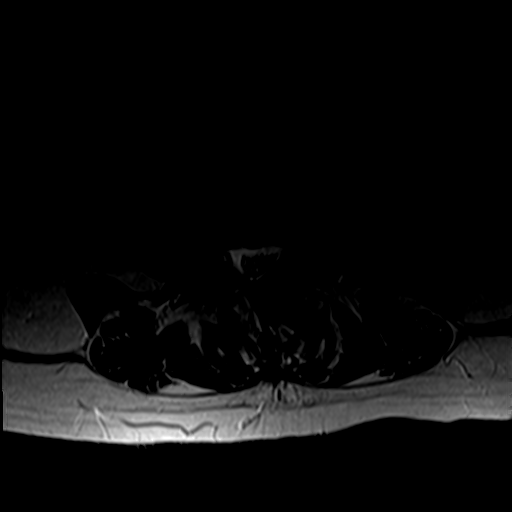
[im 26/38]
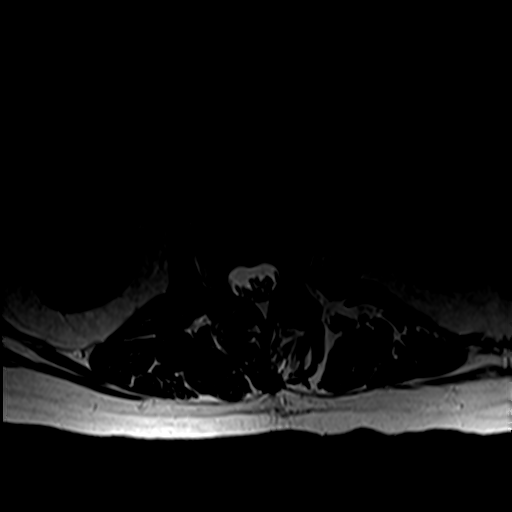
[im 32/38]
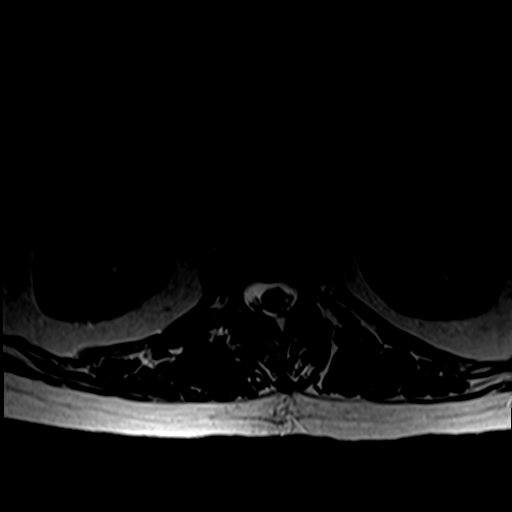
[im 38/38]
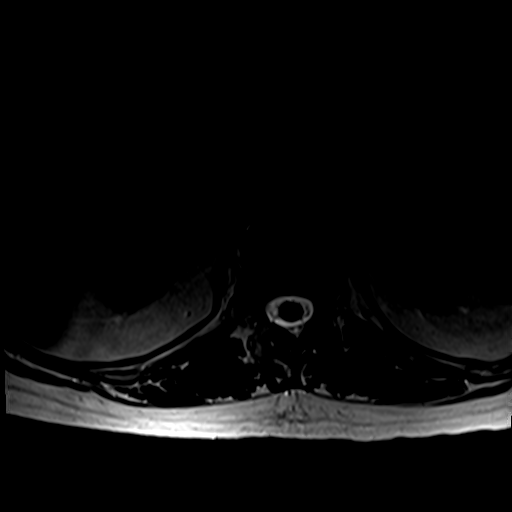

[Series 7: T1 · axial · 4.0mm · 0.39mm/px · z∈[+4,+168]mm · 6 of 38 slices shown (2 of 2)]
[im 1/38]
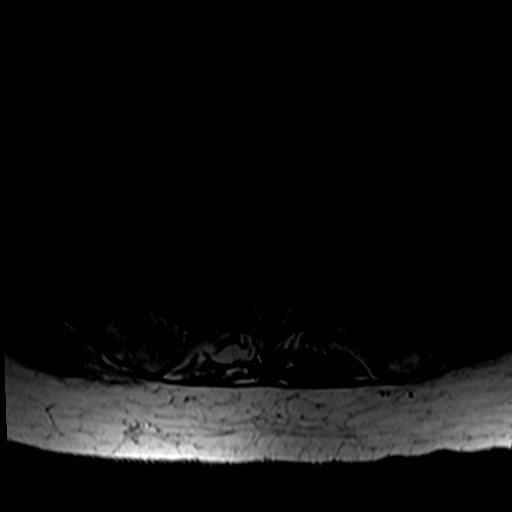
[im 6/38]
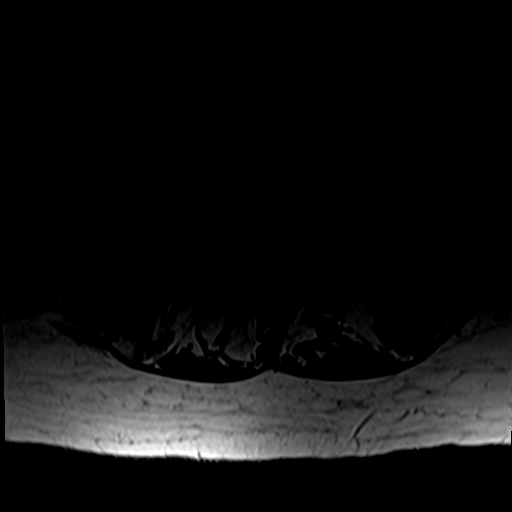
[im 12/38]
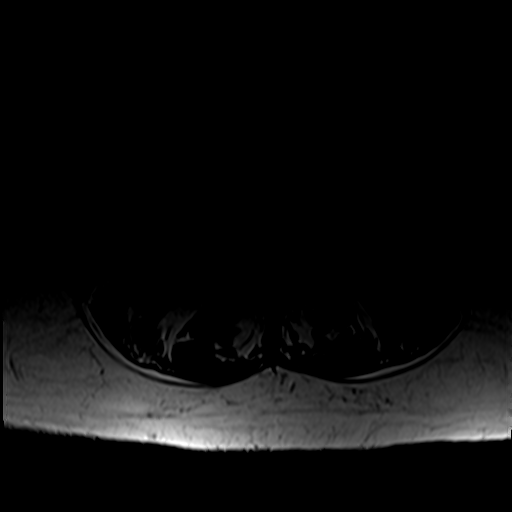
[im 18/38]
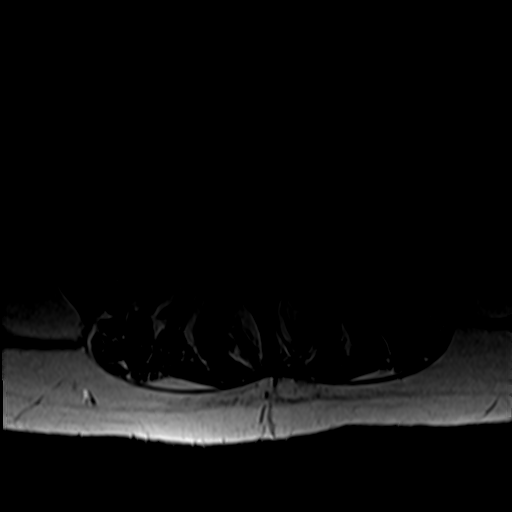
[im 20/38]
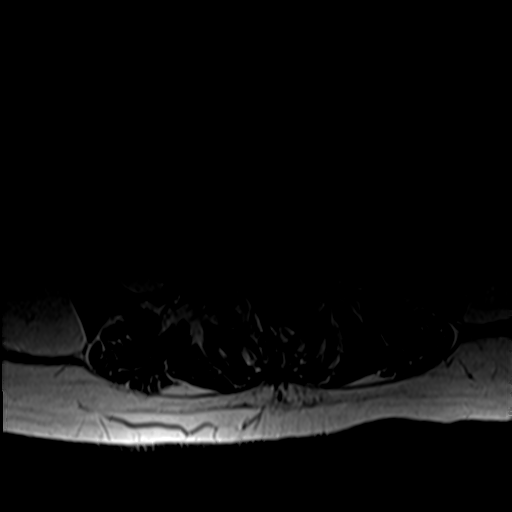
[im 32/38]
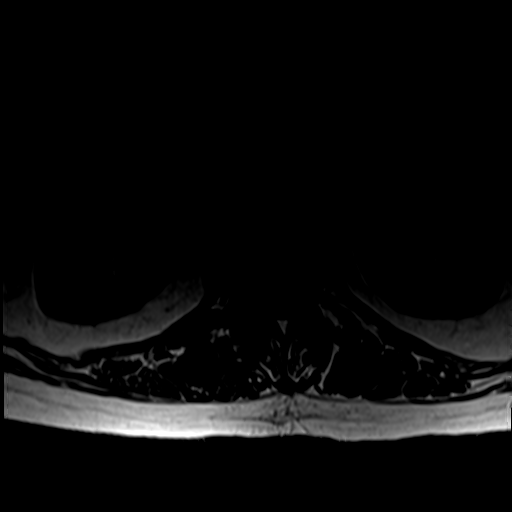

[27 of 48 positions shown; findings below may reference images not displayed]

FINDINGS: Segmentation:  Standard.

Alignment:  Dextroscoliosis with apex at L2-3.

Vertebrae: No acute abnormality. Type 2 Modic endplate changes at
L2-3 and L4-5.

Conus medullaris and cauda equina: Conus extends to the L2 level.
Conus and cauda equina appear normal.

Paraspinal and other soft tissues: Negative

Disc levels:

T10-11: Visualized only in the sagittal plane. Moderate right
foraminal stenosis.

T11-12: Unremarkable.

T12-L1: Small disc bulge with mild facet hypertrophy. No spinal
canal or neural foraminal stenosis.

L1-L2: Small left asymmetric disc bulge. No spinal canal stenosis.
Mild left neural foraminal stenosis.

L2-L3: Small left asymmetric disc bulge with endplate spurring. Left
lateral recess narrowing without central spinal canal stenosis.
Moderate left neural foraminal stenosis.

L3-L4: Small disc bulge with moderate facet hypertrophy. Right
lateral recess narrowing without central spinal canal stenosis. Mild
right neural foraminal stenosis.

L4-L5: Severe right and mild left facet arthrosis. Left foraminal
annular fissure. No spinal canal stenosis. Mild right neural
foraminal stenosis.

L5-S1: Disc space narrowing with moderate facet hypertrophy. Right
extraforaminal disc bulge. No spinal canal stenosis. Severe right
neural foraminal stenosis.

Visualized sacrum: Normal.
IMPRESSION: 1. Severe right L5-S1 neural foraminal stenosis due to combination
of disc bulge and facet arthrosis.
2. Moderate left L2-3 neural foraminal stenosis.
3. Right lateral recess narrowing at L3-L[DATE] serve as a source of
right L4 radiculopathy.
4. Moderate right T10-11 neural foraminal stenosis.
5. Multilevel facet arthrosis, which may be a source of local low
back pain.

## 2020-11-21 NOTE — Telephone Encounter (Signed)
Patient called asked if her MRI results can be given to her over the phone? Patient said she lives in [place] now. The number to contact patient is  [phone]

## 2020-11-21 NOTE — Telephone Encounter (Signed)
Please advise 

## 2020-12-03 ENCOUNTER — Ambulatory Visit: Payer: Medicare HMO | Admitting: Orthopaedic Surgery

## 2020-12-03 ENCOUNTER — Encounter: Payer: Self-pay | Admitting: Orthopaedic Surgery

## 2020-12-03 DIAGNOSIS — M4807 Spinal stenosis, lumbosacral region: Secondary | ICD-10-CM | POA: Diagnosis not present

## 2020-12-03 DIAGNOSIS — M7061 Trochanteric bursitis, right hip: Secondary | ICD-10-CM | POA: Diagnosis not present

## 2020-12-03 DIAGNOSIS — M25551 Pain in right hip: Secondary | ICD-10-CM

## 2020-12-03 MED ORDER — METHYLPREDNISOLONE ACETATE 40 MG/ML IJ SUSP
40.0000 mg | INTRAMUSCULAR | Status: AC | PRN
  Administered 2020-12-03: 40 mg via INTRA_ARTICULAR

## 2020-12-03 MED ORDER — LIDOCAINE HCL 1 % IJ SOLN
3.0000 mL | INTRAMUSCULAR | Status: AC | PRN
  Administered 2020-12-03: 3 mL

## 2020-12-03 NOTE — Progress Notes (Signed)
Office Visit Note   Patient: Latoya Garcia           Date of Birth: 08/24/1955           MRN: [id number] Visit Date: 12/03/2020              Requested by: Leeroy Cha, MD [address] [place],  [place] [address] PCP: Leeroy Cha, MD   Assessment & Plan: Visit Diagnoses:  1. Spinal stenosis of lumbosacral region   2. Pain in right hip   3. Trochanteric bursitis, right hip     Plan: I went over about the findings of the MRI of her right hip and her low back.  Per her request and my recommendation I did provide a steroid injection over the right hip trochanteric area which she tolerated well.  I gave her prescription for formal outpatient physical therapy to work on her hip and her back.  All questions and concerns were answered and addressed.  Follow-up can be as needed.  She knows to wait least 3 months between injections.  Follow-Up Instructions: No follow-ups on file.   Orders:  Orders Placed This Encounter  Procedures  . Large Joint Inj  . Large Joint Inj   No orders of the defined types were placed in this encounter.     Procedures: Large Joint Inj: R greater trochanter on 12/03/2020 9:27 AM Indications: pain and diagnostic evaluation Details: 22 G 1.5 in needle, lateral approach  Arthrogram: No  Medications: 3 mL lidocaine 1 %; 40 mg methylPREDNISolone acetate 40 MG/ML Outcome: tolerated well, no immediate complications Procedure, treatment alternatives, risks and benefits explained, specific risks discussed. Consent was given by the patient. Immediately prior to procedure a time out was called to verify the correct patient, procedure, equipment, support staff and site/side marked as required. Patient was prepped and draped in the usual sterile fashion.       Clinical Data: No additional findings.   Subjective: Chief Complaint  Patient presents with  . Right Hip - Follow-up  The patient is a long-term patient of mine  who actually now dropped from [place] where she has relocated.  She is a very active 65 year old female and we have been following her for her right hip and right low back with sciatica.  All of her pain seems to be over the lateral hip and some posterior.  There is no groin pain at all.  I did recently send her for MRIs of the lumbar spine and the hip itself given the amount of pain she was having.  She is never had a steroid injection around the hip at all nor in her back.  She says the back hurts off and on but most of her pain seems to be around the lateral hip today.  She is not a diabetic and is very healthy and thin individual.  HPI  Review of Systems There is currently listed no acute illnesses.  There is no report of headache, chest pain, shortness of breath, fever, chills, nausea, vomiting  Objective: Vital Signs: There were no vitals taken for this visit.  Physical Exam She is alert and orient x3 and in no acute distress.  She does not walk with a limp.  There is no leg length discrepancy. Ortho Exam Examination of her right hip shows smooth and fluid range of motion with no pain in the groin at all.  All of her pain is around the trochanteric area of the  hip and some in the sciatic area.  She has excellent strength. Specialty Comments:  No specialty comments available.  Imaging: No results found. The MRI of her right hip and her lumbar spine are reviewed with her showing her degenerative labral tear of the hip but also tendinosis of the hip abductor tendons and bursitis.  There is no cartilage defect in the hip joint itself but some mild thinning.  The lumbar spine MRI does show quite significant stenosis of the foramina at L4-L5 and L5-S1 to the right side.  This is multifactorial.  PMFS History: There are no problems to display for this patient.  Past Medical History:  Diagnosis Date  . Gastric ulcer   . Insomnia   . 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 Pure hypercholesterolemia   . Syncope and collapse   . Thyroid disease    hypothyroidism  . Unspecified menopausal and postmenopausal disorder     Family History  Problem Relation Age of Onset  . Cancer - Lung Brother   . Breast cancer Neg Hx     Past Surgical History:  Procedure Laterality Date  . BREAST BIOPSY Right 2017  . BUNIONECTOMY Bilateral   . CHOLECYSTECTOMY    . COLONOSCOPY    . colonso    . complete hysterectomy    . ESOPHAGOGASTRODUODENOSCOPY    . KNEE ARTHROSCOPY Right    Social History   Occupational History  . Not on file  Tobacco Use  . Smoking status: Never Smoker  . Smokeless tobacco: Never Used  Substance and Sexual Activity  . Alcohol use: Not on file  . Drug use: Not on file  . Sexual activity: Not on file

## 2020-12-13 ENCOUNTER — Telehealth: Payer: Medicare HMO | Admitting: Physician Assistant

## 2020-12-13 DIAGNOSIS — J019 Acute sinusitis, unspecified: Secondary | ICD-10-CM

## 2020-12-13 DIAGNOSIS — B9689 Other specified bacterial agents as the cause of diseases classified elsewhere: Secondary | ICD-10-CM

## 2020-12-13 MED ORDER — AMOXICILLIN-POT CLAVULANATE 875-125 MG PO TABS: 1.0000 | ORAL_TABLET | Freq: Two times a day (BID) | ORAL | 0 refills | Status: DC

## 2020-12-13 NOTE — Progress Notes (Signed)
We are sorry that you are not feeling well.  Here is how we plan to help!  Based on what you have shared with me it looks like you have sinusitis.  Sinusitis is inflammation and infection in the sinus cavities of the head.  Based on your presentation I believe you most likely have Acute Bacterial Sinusitis.  This is an infection caused by bacteria and is treated with antibiotics. I have prescribed Augmentin 875mg/125mg one tablet twice daily with food, for 7 days. You may use an oral decongestant such as Mucinex D or if you have glaucoma or high blood pressure use plain Mucinex. Saline nasal spray help and can safely be used as often as needed for congestion.  If you develop worsening sinus pain, fever or notice severe headache and vision changes, or if symptoms are not better after completion of antibiotic, please schedule an appointment with a health care provider.    Sinus infections are not as easily transmitted as other respiratory infection, however we still recommend that you avoid close contact with loved ones, especially the very young and elderly.  Remember to wash your hands thoroughly throughout the day as this is the number one way to prevent the spread of infection!  Home Care:  Only take medications as instructed by your medical team.  Complete the entire course of an antibiotic.  Do not take these medications with alcohol.  A steam or ultrasonic humidifier can help congestion.  You can place a towel over your head and breathe in the steam from hot water coming from a faucet.  Avoid close contacts especially the very young and the elderly.  Cover your mouth when you cough or sneeze.  Always remember to wash your hands.  Get Help Right Away If:  You develop worsening fever or sinus pain.  You develop a severe head ache or visual changes.  Your symptoms persist after you have completed your treatment plan.  Make sure you  Understand these instructions.  Will watch your  condition.  Will get help right away if you are not doing well or get worse.  Your e-visit answers were reviewed by a board certified advanced clinical practitioner to complete your personal care plan.  Depending on the condition, your plan could have included both over the counter or prescription medications.  If there is a problem please reply  once you have received a response from your provider.  Your safety is important to us.  If you have drug allergies check your prescription carefully.    You can use MyChart to ask questions about today's visit, request a non-urgent call back, or ask for a work or school excuse for 24 hours related to this e-Visit. If it has been greater than 24 hours you will need to follow up with your provider, or enter a new e-Visit to address those concerns.  You will get an e-mail in the next two days asking about your experience.  I hope that your e-visit has been valuable and will speed your recovery. Thank you for using e-visits.  I provided 6 minutes of non face-to-face time during this encounter for chart review and documentation.   

## 2021-07-30 ENCOUNTER — Other Ambulatory Visit: Payer: Self-pay | Admitting: Internal Medicine

## 2021-07-30 DIAGNOSIS — Z1231 Encounter for screening mammogram for malignant neoplasm of breast: Secondary | ICD-10-CM

## 2021-08-19 ENCOUNTER — Telehealth: Payer: Medicare HMO | Admitting: Nurse Practitioner

## 2021-08-19 DIAGNOSIS — F325 Major depressive disorder, single episode, in full remission: Secondary | ICD-10-CM | POA: Insufficient documentation

## 2021-08-19 DIAGNOSIS — K259 Gastric ulcer, unspecified as acute or chronic, without hemorrhage or perforation: Secondary | ICD-10-CM | POA: Insufficient documentation

## 2021-08-19 DIAGNOSIS — J0110 Acute frontal sinusitis, unspecified: Secondary | ICD-10-CM

## 2021-08-19 DIAGNOSIS — E7800 Pure hypercholesterolemia, unspecified: Secondary | ICD-10-CM | POA: Insufficient documentation

## 2021-08-19 DIAGNOSIS — M3509 Sicca syndrome with other organ involvement: Secondary | ICD-10-CM | POA: Insufficient documentation

## 2021-08-19 DIAGNOSIS — R634 Abnormal weight loss: Secondary | ICD-10-CM | POA: Insufficient documentation

## 2021-08-19 DIAGNOSIS — K2960 Other gastritis without bleeding: Secondary | ICD-10-CM | POA: Insufficient documentation

## 2021-08-19 MED ORDER — AMOXICILLIN-POT CLAVULANATE 875-125 MG PO TABS
1.0000 | ORAL_TABLET | Freq: Two times a day (BID) | ORAL | 0 refills | Status: AC
Start: 2021-08-19 — End: 2021-08-26

## 2021-08-19 NOTE — Progress Notes (Signed)
E-Visit for Sinus Problems  We are sorry that you are not feeling well.  Here is how we plan to help!  Based on what you have shared with me it looks like you have sinusitis.  Sinusitis is inflammation and infection in the sinus cavities of the head.  Based on your presentation I believe you most likely have Acute Bacterial Sinusitis.  This is an infection caused by bacteria and is treated with antibiotics. I have prescribed Augmentin 875mg /125mg  one tablet twice daily with food, for 7 days. You may use an oral decongestant such as Mucinex D or if you have glaucoma or high blood pressure use plain Mucinex. Saline nasal spray help and can safely be used as often as needed for congestion.  If you develop worsening sinus pain, fever or notice severe headache and vision changes, or if symptoms are not better after completion of antibiotic, please schedule an appointment with a health care provider.    Sinus infections are not as easily transmitted as other respiratory infection, however we still recommend that you avoid close contact with loved ones, especially the very young and elderly.  Remember to wash your hands thoroughly throughout the day as this is the number one way to prevent the spread of infection!  Home Care: Only take medications as instructed by your medical team. Complete the entire course of an antibiotic. Do not take these medications with alcohol. A steam or ultrasonic humidifier can help congestion.  You can place a towel over your head and breathe in the steam from hot water coming from a faucet. Avoid close contacts especially the very young and the elderly. Cover your mouth when you cough or sneeze. Always remember to wash your hands.  Get Help Right Away If: You develop worsening fever or sinus pain. You develop a severe head ache or visual changes. Your symptoms persist after you have completed your treatment plan.  Make sure you Understand these instructions. Will watch  your condition. Will get help right away if you are not doing well or get worse.  Thank you for choosing an e-visit.  Your e-visit answers were reviewed by a board certified advanced clinical practitioner to complete your personal care plan. Depending upon the condition, your plan could have included both over the counter or prescription medications.  Please review your pharmacy choice. Make sure the pharmacy is open so you can pick up prescription now. If there is a problem, you may contact your provider through [employer] and have the prescription routed to another pharmacy.  Your safety is important to [place]. If you have drug allergies check your prescription carefully.   For the next 24 hours you can use MyChart to ask questions about today's visit, request a non-urgent call back, or ask for a work or school excuse. You will get an email in the next two days asking about your experience. I hope that your e-visit has been valuable and will speed your recovery.   I spent approximately 5 minutes reviewing the patient's history, current symptoms and coordinating their plan of care today.

## 2021-09-22 ENCOUNTER — Other Ambulatory Visit: Payer: Self-pay

## 2021-09-22 ENCOUNTER — Ambulatory Visit
Admission: RE | Admit: 2021-09-22 | Discharge: 2021-09-22 | Disposition: A | Discharge status: Home / Self Care | Payer: Medicare HMO | Source: Ambulatory Visit | Attending: Internal Medicine | Admitting: Internal Medicine

## 2021-09-22 DIAGNOSIS — Z1231 Encounter for screening mammogram for malignant neoplasm of breast: Secondary | ICD-10-CM

## 2022-06-18 ENCOUNTER — Telehealth: Payer: Medicare HMO | Admitting: Physician Assistant

## 2022-06-18 DIAGNOSIS — J0110 Acute frontal sinusitis, unspecified: Secondary | ICD-10-CM | POA: Diagnosis not present

## 2022-06-18 MED ORDER — FLUTICASONE PROPIONATE 50 MCG/ACT NA SUSP: 2.0000 | Freq: Every day | NASAL | 0 refills | Status: AC

## 2022-06-18 MED ORDER — AMOXICILLIN-POT CLAVULANATE 875-125 MG PO TABS: 1.0000 | ORAL_TABLET | Freq: Two times a day (BID) | ORAL | 0 refills | Status: DC

## 2022-06-18 NOTE — Progress Notes (Signed)
I have spent 5 minutes in review of e-visit questionnaire, review and updating patient chart, medical decision making and response to patient.   Korrine Sicard Cody Song Myre, PA-C    

## 2022-06-18 NOTE — Progress Notes (Signed)
E-Visit for Sinus Problems  We are sorry that you are not feeling well.  Here is how we plan to help!  Based on what you have shared with me it looks like you have sinusitis.  Sinusitis is inflammation and infection in the sinus cavities of the head.  Based on your presentation I believe you most likely have Acute Bacterial Sinusitis.  This is an infection caused by bacteria and is treated with antibiotics. I have prescribed Augmentin '875mg'$ /'125mg'$  one tablet twice daily with food, for 7 days. I have also prescribed a nasal steroid spray to use once daily.  You may use an oral decongestant such as Mucinex D or if you have glaucoma or high blood pressure use plain Mucinex. Saline nasal spray help and can safely be used as often as needed for congestion.  If you develop worsening sinus pain, fever or notice severe headache and vision changes, or if symptoms are not better after completion of antibiotic, please schedule an appointment with a health care provider.    Sinus infections are not as easily transmitted as other respiratory infection, however we still recommend that you avoid close contact with loved ones, especially the very young and elderly.  Remember to wash your hands thoroughly throughout the day as this is the number one way to prevent the spread of infection!  Home Care: Only take medications as instructed by your medical team. Complete the entire course of an antibiotic. Do not take these medications with alcohol. A steam or ultrasonic humidifier can help congestion.  You can place a towel over your head and breathe in the steam from hot water coming from a faucet. Avoid close contacts especially the very young and the elderly. Cover your mouth when you cough or sneeze. Always remember to wash your hands.  Get Help Right Away If: You develop worsening fever or sinus pain. You develop a severe head ache or visual changes. Your symptoms persist after you have completed your treatment  plan.  Make sure you Understand these instructions. Will watch your condition. Will get help right away if you are not doing well or get worse.  Thank you for choosing an e-visit.  Your e-visit answers were reviewed by a board certified advanced clinical practitioner to complete your personal care plan. Depending upon the condition, your plan could have included both over the counter or prescription medications.  Please review your pharmacy choice. Make sure the pharmacy is open so you can pick up prescription now. If there is a problem, you may contact your provider through [employer] and have the prescription routed to another pharmacy.  Your safety is important to [place]. If you have drug allergies check your prescription carefully.   For the next 24 hours you can use MyChart to ask questions about today's visit, request a non-urgent call back, or ask for a work or school excuse. You will get an email in the next two days asking about your experience. I hope that your e-visit has been valuable and will speed your recovery.

## 2022-08-20 ENCOUNTER — Encounter: Payer: Self-pay | Admitting: Physician Assistant

## 2022-08-20 ENCOUNTER — Ambulatory Visit (INDEPENDENT_AMBULATORY_CARE_PROVIDER_SITE_OTHER): Payer: Medicare HMO

## 2022-08-20 ENCOUNTER — Ambulatory Visit: Payer: Medicare HMO | Admitting: Physician Assistant

## 2022-08-20 VITALS — Ht 64.0 in | Wt 155.0 lb

## 2022-08-20 DIAGNOSIS — M25552 Pain in left hip: Secondary | ICD-10-CM

## 2022-08-20 DIAGNOSIS — M545 Low back pain, unspecified: Secondary | ICD-10-CM

## 2022-08-20 MED ORDER — METHYLPREDNISOLONE 4 MG PO TBPK: ORAL_TABLET | ORAL | 0 refills | Status: DC

## 2022-08-20 NOTE — Progress Notes (Signed)
Office Visit Note   Patient: Latoya Garcia           Date of Birth: [birth year]           MRN: [id number] Visit Date: 08/20/2022              Requested by: Leeroy Cha, MD [address] [place],  [place] [address] PCP: Leeroy Cha, MD  Chief Complaint  Patient presents with   Left Hip - Pain   Lower Back - Pain      HPI: Latoya Garcia is a pleasant 67 year old woman who is a patient of Dr. Trevor Mace.  She has a history of hip pain and low back pain.  She comes in today complaining of back and left hip pain has been severe enough that she rated it as a 10 out of 10 earlier this week no particular injury.  It is now calm down a little bit to a 7 out of 10.  She said the pain is across her back and then goes down into her hip and wraps around into her groin.  She had a very similar experience with the right side.  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  While the right pain is not an issue she does have significant symptoms on the left.  Assessment & Plan: Visit Diagnoses:  1. Acute bilateral low back pain, unspecified whether sciatica present   2. Pain in left hip     Plan: She has similar symptoms today that she had in 2022 although they are all on the left rather than the right.  I do not think this is coming from her hip.  She has great hip motion and no pain.  I think this probably is radicular in nature given her x-rays.  I would like for her to obtain a new MRI and then follow-up with Barnet Pall or Dr. Ernestina Patches for to evaluate to see if any injections may be of help to her.  She is in agreement with this plan in the meantime I will give her a Medrol Dosepak to help alleviate some of her symptoms  Follow-Up Instructions: After MRI with Dr. Ernestina Patches or Margarette Canada Exam  Patient is alert, oriented, no adenopathy, well-dressed, normal affect, normal respiratory effort. Examination of her lower back she does  have some tenderness to palpation that to the left side of the back.  She has no groin pain or pain with manipulation of her hip.  She has no pain that extends below her thigh.  She has strength that is 5 out of 5 with dorsiflexion plantarflexion extension and flexion of her legs and her sensation is intact.  She does have a noted scoliosis no redness erythema in her spine  Imaging: XR Lumbar Spine 2-3 Views  Result Date: 08/20/2022 Radiographs of her lumbar spine were obtained today in 2 projections.  She has a degenerative scoliosis to the right.  She does have significant degenerative changes and complete loss of joint space at L3-4.  She also has advanced facet arthropathy and endplate osteophytes  XR HIP UNILAT W OR W/O PELVIS 2-3 VIEWS LEFT  Result Date: 08/20/2022 Radiographs of her left hip demonstrate overall well-preserved joint spacing.  Just slight inferior sclerotic changes.  No evidence of any fracture.  X-rays are very similar to previous ones taken.  No images are attached to the encounter.  Labs: Lab Results  Component Value  Date   ESRSEDRATE 19 04/18/2015   CRP <0.50 04/18/2015     Lab Results  Component Value Date   ALBUMIN 4.1 02/06/2009    No results found for: "MG" No results found for: "VD25OH"  No results found for: "PREALBUMIN"    Latest Ref Rng & Units 04/18/2015    1:31 PM 02/06/2009    2:36 PM 11/08/2007    4:19 PM  CBC EXTENDED  WBC 4.0 - 10.5 K/uL 8.5  5.1  6.7   RBC 3.87 - 5.11 MIL/uL 4.32  4.46  4.31   Hemoglobin 12.0 - 15.0 g/dL 13.6  14.2  13.8   HCT 36.0 - 46.0 % 41.3  41.3  40.7   Platelets 150 - 400 K/uL 280  243  264   NEUT# 1.7 - 7.7 K/uL 7.0  3.2  4.1   Lymph# 0.7 - 4.0 K/uL 0.9  1.4  1.9      Body mass index is 26.61 kg/m.  Orders:  Orders Placed This Encounter  Procedures   XR Lumbar Spine 2-3 Views   XR HIP UNILAT W OR W/O PELVIS 2-3 VIEWS LEFT   MR Lumbar Spine w/o contrast   Meds ordered this encounter  Medications    methylPREDNISolone (MEDROL DOSEPAK) 4 MG TBPK tablet    Sig: Take as directed.    Dispense:  21 tablet    Refill:  0     Procedures: No procedures performed  Clinical Data: No additional findings.  ROS:  All other systems negative, except as noted in the HPI. Review of Systems  Objective: Vital Signs: Ht '5\' 4"'$  (1.626 m)   Wt 155 lb (70.3 kg)   BMI 26.61 kg/m   Specialty Comments:  No specialty comments available.  PMFS History: Patient Active Problem List   Diagnosis Date Noted   Bile reflux gastritis 08/19/2021   Gastric ulcer 08/19/2021   Major depression in complete remission ([place]) 08/19/2021   Pure hypercholesterolemia 08/19/2021   Sjogren syndrome with other organ involvement ([place]) 08/19/2021   Weight loss 08/19/2021   NPH (normal pressure hydrocephalus) ([place] [place]) 11/21/2020   S/P VP shunt 11/21/2020   Trigeminal neuralgia of right side of face 11/25/2017   Hydrocephalus ([facility]) 05/15/2015   ADD (attention deficit disorder) 05/14/2015   Depression 05/14/2015   Hypothyroidism 05/14/2015   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   Past Medical History:  Diagnosis Date   Gastric ulcer    Insomnia    Major depressive disorder, single episode in full remission ([place])    Pure hypercholesterolemia    Syncope and collapse    Thyroid disease    hypothyroidism   Unspecified menopausal and postmenopausal disorder     Family History  Problem Relation Age of Onset   Cancer - Lung Brother    Breast cancer Neg Hx     Past Surgical History:  Procedure Laterality Date   BREAST BIOPSY Right 2017   BUNIONECTOMY Bilateral    CHOLECYSTECTOMY     COLONOSCOPY     colonso     complete hysterectomy     ESOPHAGOGASTRODUODENOSCOPY     KNEE ARTHROSCOPY Right    Social History   Occupational History   Not on file  Tobacco Use   Smoking status: Never   Smokeless tobacco: Never  Substance and Sexual Activity   Alcohol use: Not on file   Drug use: Not on file    Sexual activity: Not on file

## 2022-08-27 ENCOUNTER — Telehealth: Payer: Self-pay

## 2022-08-27 NOTE — Telephone Encounter (Signed)
Patient called triage.  She states that it has been a week since her MRI has been ordered and she has not heard anything. I explained that it appears we are waiting to hear from insurance for approval. [phone]

## 2022-08-28 NOTE — Telephone Encounter (Signed)
[facility] has been trying to contact Latoya Garcia to schedule. I called Latoya Garcia this morning and she states she is going to call and schedule today.

## 2022-09-10 ENCOUNTER — Ambulatory Visit
Admission: RE | Admit: 2022-09-10 | Discharge: 2022-09-10 | Disposition: A | Discharge status: Home / Self Care | Payer: Medicare HMO | Source: Ambulatory Visit | Attending: Physician Assistant | Admitting: Physician Assistant

## 2022-09-10 DIAGNOSIS — M545 Low back pain, unspecified: Secondary | ICD-10-CM

## 2022-09-10 DIAGNOSIS — M25552 Pain in left hip: Secondary | ICD-10-CM

## 2022-09-11 ENCOUNTER — Telehealth: Payer: Self-pay | Admitting: Physical Medicine and Rehabilitation

## 2022-09-11 NOTE — Telephone Encounter (Signed)
Patient returned call asked for a call back to schedule an appointment with Dr. Ernestina Patches. The number to contact patient is  [phone]

## 2022-09-11 NOTE — Telephone Encounter (Signed)
OV scheduled for MRI review 09/15/22

## 2022-09-15 ENCOUNTER — Encounter: Payer: Self-pay | Admitting: Physical Medicine and Rehabilitation

## 2022-09-15 ENCOUNTER — Ambulatory Visit (INDEPENDENT_AMBULATORY_CARE_PROVIDER_SITE_OTHER): Payer: Medicare HMO | Admitting: Physical Medicine and Rehabilitation

## 2022-09-15 DIAGNOSIS — R1031 Right lower quadrant pain: Secondary | ICD-10-CM | POA: Diagnosis not present

## 2022-09-15 DIAGNOSIS — G8929 Other chronic pain: Secondary | ICD-10-CM

## 2022-09-15 DIAGNOSIS — M47816 Spondylosis without myelopathy or radiculopathy, lumbar region: Secondary | ICD-10-CM

## 2022-09-15 DIAGNOSIS — M545 Low back pain, unspecified: Secondary | ICD-10-CM | POA: Diagnosis not present

## 2022-09-15 DIAGNOSIS — R1032 Left lower quadrant pain: Secondary | ICD-10-CM

## 2022-09-15 NOTE — Progress Notes (Signed)
Functional Pain Scale - descriptive words and definitions  Moderate (4)   Constantly aware of pain, can complete ADLs with modification/sleep marginally affected at times/passive distraction is of no use, but active distraction gives some relief. Moderate range order  Average Pain 4  Lower back pain with left hip pain. Constant pain in left hip ranging from 5 or worse. MRI review

## 2022-09-15 NOTE — Progress Notes (Signed)
Latoya Garcia - 67 y.o. female MRN [id number]  Date of birth: Sep 05, 1955  Office Visit Note: Visit Date: 09/15/2022 PCP: Leeroy Cha, MD Referred by: Leeroy Cha,*  Subjective: No chief complaint on file.  HPI: Latoya Garcia is a 67 y.o. female who comes in today per the request of [place], PA for evaluation of chronic, worsening and severe bilateral lower back pain. She does experience intermittent pain radiating to bilateral groin and lateral hip regions, left greater than right. Pain ongoing for several years and worsens with walking. States bilateral lower back pain seems to be constant and most severe discomfort. Some relief of pain with home exercise regimen, rest and use of medications. Patient states she is very active, does walk several miles a week and participates in pilates classes.  Uses Tramadol intermittently for severe pain. Recent lumbar MRI imaging exhibits multi level facet arthropathy, most severe at the level of L4-L5 where there is severe right facet arthropathy with possible cortical destruction and small bilateral facet joint effusions. MRI report reflects degenerative facet changes, however states septic arthritis could not be excluded. There is mild to moderate spinal canal stenosis at L3-L4. Patient was evaluated by Dr. Jean Rosenthal in 2022 and did perform right greater trochanter injection, minimal relief of pain with this injection. Right hip MRI from 2022 exhibits mild to moderate degenerative changes with associated degenerative tearing of the anterior and superior labrum. Dr. Ninfa Linden did obtain lumbar MRI in 2022 that exhibits multi level facet arthrosis. More recently patient evaluated by Bevely Palmer Persons, PA, per her notes she does not feel this is hip related, recent left hip x-rays show only mild degenerative changes, does have good painless range of motion to left hip. Patient denies focal weakness, numbness and tingling.  No recent trauma or falls.      Review of Systems  Musculoskeletal:  Positive for back pain.  Neurological:  Negative for tingling, sensory change, focal weakness and weakness.  All other systems reviewed and are negative.  Otherwise per HPI.  Assessment & Plan: Visit Diagnoses:    ICD-10-CM   1. Chronic bilateral low back pain without sciatica  M54.50    G89.29     2. Spondylosis without myelopathy or radiculopathy, lumbar region  M47.816     3. Facet arthropathy, lumbar  M47.816     4. Bilateral groin pain  R10.31    R10.32        Plan: Findings:  Chronic, worsening and severe bilateral lower back pain. Intermittent radiation of pain to bilateral groin and lateral hip regions. Lower back most severe source of pain. Patient continues to have severe pain despite good conservative therapies such as home exercise regimen, rest and use of medications.  Patient's clinical presentation and exam are consistent with facet mediated pain.  There is advanced facet arthropathy at the level of L4-L5, worse on the right where there is soft tissue edema.  Small bilateral joint effusions noted.  We feel this is less likely septic arthritis as I was unable to illicit pain upon palpation of lower back. No evidence of fever or neurological impairment. Pain to bilateral groin and lateral hips could be referral from L4 facet joint. Next step is to perform diagnostic and hopefully therapeutic bilateral L4-L5 facet joint injections under fluoroscopic guidance. If good relief with injections we can repeat infrequently as needed. If her pain proves to be more left hip related we would look at one time diagnostic left intra-articular  hip injection. Would also consider re-evaluation with Dr. Ninfa Linden. I do recommend physical therapy for strengthening post injection if we can get her pain to manageable level. Patient recently moved to [place] and would like to have PT there. No red flag symptoms noted upon exam.      Meds & Orders: No orders of the defined types were placed in this encounter.  No orders of the defined types were placed in this encounter.   Follow-up: Return for Bilateral L4-L5 facet joint injections.   Procedures: No procedures performed      Clinical History: EXAM: MRI LUMBAR SPINE WITHOUT CONTRAST   TECHNIQUE: Multiplanar, multisequence MR imaging of the lumbar spine was performed. No intravenous contrast was administered.   COMPARISON:  Lumbar spine MRI 11/21/2020   FINDINGS: Segmentation: Standard; the lowest formed disc space is designated L5-S1.   Alignment: There is dextrocurvature centered at L2-L3, unchanged. There is unchanged trace retrolisthesis of T12 on L1.   Vertebrae: Vertebral body heights are preserved, without evidence of acute injury. There is degenerative endplate signal abnormality with mild edema along the left aspect of the L1-L2 disc space, increased since the prior study. There is additional multilevel degenerative endplate signal abnormality without edema most notable at at L2-L3 which is similar to the prior study.   There is marrow edema in the right posterior elements at L4 with advanced facet arthropathy at this level. There is T1 hypointensity and cortical irregularity raising the possibility of septic arthritis. Findings are progressed since the prior study.   Conus medullaris and cauda equina: Conus extends to the L1-L2 level. Conus and cauda equina appear normal.   Paraspinal and other soft tissues: There is perifacetal soft tissue edema on the left at L3-L4 and on the right at L4-L5, more notable at L4-L5.   Disc levels:   T10-T11: Facet arthropathy resulting in moderate right neural foraminal stenosis unchanged.   T11-T12: There is a shallow central protrusion without significant spinal canal or neural foraminal stenosis, unchanged.   T12-L1: There is a disc bulge eccentric to the right and bilateral facet arthropathy  without significant spinal canal or neural foraminal stenosis   L1-L2: There is a disc bulge eccentric to the left and left worse than right facet arthropathy resulting in mild left subarticular zone narrowing and moderate left and no significant right neural foraminal stenosis. The left neural foraminal stenosis is slightly progressed.   L2-L3: There is left worse than right degenerative endplate spurring and facet arthropathy resulting in mild spinal canal and left subarticular zone narrowing and mild to moderate left and mild right neural foraminal stenosis, not significantly changed.   L3-L4: There is a disc bulge eccentric to the left and moderate left worse than right facet arthropathy resulting in mild-to-moderate spinal canal stenosis and moderate right and mild left neural foraminal stenosis unchanged.   L4-L5: There is a left foraminal annular fissure. There is severe right facet arthropathy with possible cortical destruction and small bilateral facet joint effusions which has progressed since the prior study. Findings result in mild right and no significant left neural foraminal stenosis and no significant spinal canal stenosis.   L5-S1: There is a right foraminal protrusion and moderate right facet arthropathy resulting in severe right and no significant left neural foraminal stenosis and no significant spinal canal stenosis, unchanged.   IMPRESSION: 1. Marrow edema in the right posterior elements at L4 with advanced facet arthropathy and possible cortical destruction and perifacetal soft tissue edema, progressed since the  prior study. Findings may reflect advanced degenerative change; however, septic arthritis could have a similar appearance. Correlate with symptoms and inflammatory markers. 2. Moderate left neural foraminal stenosis at L1-L2 is slightly progressed since the prior study. 3. Mild-to-moderate spinal canal stenosis and moderate right neural foraminal  stenosis at L3-L4 and severe right neural foraminal stenosis at L5-S1 are unchanged. 4. Unchanged dextrocurvature centered at L2-L3.     Electronically Signed   By: Valetta Mole M.D.   On: 09/10/2022 13:32   She reports that she has never smoked. She has never used smokeless tobacco. No results for input(s): "HGBA1C", "LABURIC" in the last 8760 hours.  Objective:  VS:  HT:    WT:   BMI:     BP:   HR: bpm  TEMP: ( )  RESP:  Physical Exam Vitals and nursing note reviewed.  HENT:     Head: Normocephalic and atraumatic.     Right Ear: External ear normal.     Left Ear: External ear normal.     Nose: Nose normal.     Mouth/Throat:     Mouth: Mucous membranes are moist.  Eyes:     Extraocular Movements: Extraocular movements intact.  Cardiovascular:     Rate and Rhythm: Normal rate.     Pulses: Normal pulses.  Pulmonary:     Effort: Pulmonary effort is normal.  Abdominal:     General: Abdomen is flat. There is no distension.  Musculoskeletal:        General: Tenderness present.     Cervical back: Normal range of motion.     Comments: Pt rises from seated position to standing without difficulty. Concordant low back pain with facet loading, lumbar spine extension and rotation. Strong distal strength without clonus, no pain upon palpation of greater trochanters. Sensation intact bilaterally. Walks independently, gait steady.   Skin:    General: Skin is warm and dry.     Capillary Refill: Capillary refill takes less than 2 seconds.  Neurological:     General: No focal deficit present.     Mental Status: She is alert and oriented to person, place, and time.  Psychiatric:        Mood and Affect: Mood normal.        Behavior: Behavior normal.     Ortho Exam  Imaging: No results found.  Past Medical/Family/Surgical/Social History: Medications & Allergies reviewed per EMR, new medications updated. Patient Active Problem List   Diagnosis Date Noted   Bile reflux gastritis  08/19/2021   Gastric ulcer 08/19/2021   Major depression in complete remission ([facility] [place]) 08/19/2021   Pure hypercholesterolemia 08/19/2021   Sjogren syndrome with other organ involvement ([facility]) 08/19/2021   Weight loss 08/19/2021   NPH (normal pressure hydrocephalus) ([facility]) 11/21/2020   S/P VP shunt 11/21/2020   Trigeminal neuralgia of right side of face 11/25/2017   Hydrocephalus ([facility]) 05/15/2015   ADD (attention deficit disorder) 05/14/2015   Depression 05/14/2015   Hypothyroidism 05/14/2015   Benign neoplasm of cranial nerves ([facility]) 08/30/2012   Past Medical History:  Diagnosis Date   Gastric ulcer    Insomnia    Major depressive disorder, single episode in full remission ([facility])    Pure hypercholesterolemia    Syncope and collapse    Thyroid disease    hypothyroidism   Unspecified menopausal and postmenopausal disorder    Family History  Problem Relation Age of Onset   Cancer - Lung Brother    Breast cancer Neg Hx  Past Surgical History:  Procedure Laterality Date   BREAST BIOPSY Right 2017   BUNIONECTOMY Bilateral    CHOLECYSTECTOMY     COLONOSCOPY     colonso     complete hysterectomy     ESOPHAGOGASTRODUODENOSCOPY     KNEE ARTHROSCOPY Right    Social History   Occupational History   Not on file  Tobacco Use   Smoking status: Never   Smokeless tobacco: Never  Substance and Sexual Activity   Alcohol use: Not on file   Drug use: Not on file   Sexual activity: Not on file

## 2022-09-15 NOTE — Progress Notes (Signed)
   09/15/22 [place] in the past year? 0  Number of falls in past year 0  Was there an injury with Fall? 0  Fall Risk Category Calculator 0  Patient Fall Risk Level Low Fall Risk  Fall Risk  Patient at Risk for Falls Due to No Fall Risks  Fall risk Follow up Falls evaluation completed

## 2023-06-14 ENCOUNTER — Telehealth: Payer: Self-pay | Admitting: Physical Medicine and Rehabilitation

## 2023-06-14 NOTE — Telephone Encounter (Signed)
Pt called asking to set an appt. Pt states referral was sent a few months ago for injection. Her back starting feeling better and now it's worse. Please call pt to set an appt. Pt phone number is [phone].

## 2023-06-15 ENCOUNTER — Other Ambulatory Visit: Payer: Self-pay | Admitting: Physical Medicine and Rehabilitation

## 2023-06-15 DIAGNOSIS — G8929 Other chronic pain: Secondary | ICD-10-CM

## 2023-06-15 DIAGNOSIS — M47816 Spondylosis without myelopathy or radiculopathy, lumbar region: Secondary | ICD-10-CM

## 2023-06-17 ENCOUNTER — Telehealth: Payer: Self-pay | Admitting: Physical Medicine and Rehabilitation

## 2023-06-17 NOTE — Telephone Encounter (Signed)
Patient called to get an appointment for the injection in her back. CB#[phone]

## 2023-06-28 ENCOUNTER — Ambulatory Visit: Payer: Medicare HMO | Admitting: Physical Medicine and Rehabilitation

## 2023-06-28 ENCOUNTER — Other Ambulatory Visit: Payer: Self-pay

## 2023-06-28 DIAGNOSIS — M47816 Spondylosis without myelopathy or radiculopathy, lumbar region: Secondary | ICD-10-CM

## 2023-06-28 MED ORDER — METHYLPREDNISOLONE ACETATE 40 MG/ML IJ SUSP
40.0000 mg | Freq: Once | INTRAMUSCULAR | Status: AC
  Administered 2023-06-28: 40 mg

## 2023-06-28 NOTE — Patient Instructions (Signed)

## 2023-06-28 NOTE — Procedures (Signed)
Lumbar Facet Joint Intra-Articular Injection(s) with Fluoroscopic Guidance  Patient: Latoya Garcia      Date of Birth: 07-Dec-1955 MRN: [id number] PCP: Lorenda Ishihara, MD      Visit Date: 06/28/2023   Universal Protocol:    Date/Time: 06/28/2023  Consent Given By: the patient  Position: PRONE   Additional Comments: Vital signs were monitored before and after the procedure. Patient was prepped and draped in the usual sterile fashion. The correct patient, procedure, and site was verified.   Injection Procedure Details:  Procedure Site One Meds Administered:  Meds ordered this encounter  Medications   methylPREDNISolone acetate (DEPO-MEDROL) injection 40 mg     Laterality: Bilateral  Location/Site:  L4-L5  Needle size: 22 guage  Needle type: Spinal  Needle Placement: Articular  Findings:  -Comments: Excellent flow of contrast producing a partial arthrogram.  Procedure Details: The fluoroscope beam is vertically oriented in AP, and the inferior recess is visualized beneath the lower pole of the inferior apophyseal process, which represents the target point for needle insertion. When direct visualization is difficult the target point is located at the medial projection of the vertebral pedicle. The region overlying each aforementioned target is locally anesthetized with a 1 to 2 ml. volume of 1% Lidocaine without Epinephrine.   The spinal needle was inserted into each of the above mentioned facet joints using biplanar fluoroscopic guidance. A 0.25 to 0.5 ml. volume of Isovue-250 was injected and a partial facet joint arthrogram was obtained. A single spot film was obtained of the resulting arthrogram.    One to 1.25 ml of the steroid/anesthetic solution was then injected into each of the facet joints noted above.   Additional Comments:  The patient tolerated the procedure well Dressing: 2 x 2 sterile gauze and Band-Aid    Post-procedure details: Patient was  observed during the procedure. Post-procedure instructions were reviewed.  Patient left the clinic in stable condition.

## 2023-06-28 NOTE — Progress Notes (Signed)
Functional Pain Scale - descriptive words and definitions  Unmanageable (7)  Pain interferes with normal ADL's/nothing seems to help/sleep is very difficult/active distractions are very difficult to concentrate on. Severe range order  Average Pain 7 L>R   +Driver, -BT, -Dye Allergies.

## 2023-06-28 NOTE — Progress Notes (Signed)
Latoya Garcia - 67 y.o. female MRN [id number]  Date of birth: 1956/05/22  Office Visit Note: Visit Date: 06/28/2023 PCP: Lorenda Ishihara, MD Referred by: Lorenda Ishihara,*  Subjective: Chief Complaint  Patient presents with   Lower Back - Pain   HPI:  Latoya Garcia is a 67 y.o. female who comes in today at the request of Ellin Goodie, FNP for planned Bilateral  L4-5 Lumbar facet/medial branch block with fluoroscopic guidance.  The patient has failed conservative care including home exercise, medications, time and activity modification.  This injection will be diagnostic and hopefully therapeutic.  Please see requesting physician notes for further details and justification.  Exam has shown concordant pain with facet joint loading.   ROS Otherwise per HPI.  Assessment & Plan: Visit Diagnoses:    ICD-10-CM   1. Spondylosis without myelopathy or radiculopathy, lumbar region  M47.816 XR C-ARM NO REPORT    Facet Injection    methylPREDNISolone acetate (DEPO-MEDROL) injection 40 mg      Plan: No additional findings.   Meds & Orders:  Meds ordered this encounter  Medications   methylPREDNISolone acetate (DEPO-MEDROL) injection 40 mg    Orders Placed This Encounter  Procedures   Facet Injection   XR C-ARM NO REPORT    Follow-up: Return for visit to requesting provider as needed.   Procedures: No procedures performed  Lumbar Facet Joint Intra-Articular Injection(s) with Fluoroscopic Guidance  Patient: Latoya Garcia      Date of Birth: 04/21/1956 MRN: [id number] PCP: Lorenda Ishihara, MD      Visit Date: 06/28/2023   Universal Protocol:    Date/Time: 06/28/2023  Consent Given By: the patient  Position: PRONE   Additional Comments: Vital signs were monitored before and after the procedure. Patient was prepped and draped in the usual sterile fashion. The correct patient, procedure, and site was verified.   Injection Procedure Details:   Procedure Site One Meds Administered:  Meds ordered this encounter  Medications   methylPREDNISolone acetate (DEPO-MEDROL) injection 40 mg     Laterality: Bilateral  Location/Site:  L4-L5  Needle size: 22 guage  Needle type: Spinal  Needle Placement: Articular  Findings:  -Comments: Excellent flow of contrast producing a partial arthrogram.  Procedure Details: The fluoroscope beam is vertically oriented in AP, and the inferior recess is visualized beneath the lower pole of the inferior apophyseal process, which represents the target point for needle insertion. When direct visualization is difficult the target point is located at the medial projection of the vertebral pedicle. The region overlying each aforementioned target is locally anesthetized with a 1 to 2 ml. volume of 1% Lidocaine without Epinephrine.   The spinal needle was inserted into each of the above mentioned facet joints using biplanar fluoroscopic guidance. A 0.25 to 0.5 ml. volume of Isovue-250 was injected and a partial facet joint arthrogram was obtained. A single spot film was obtained of the resulting arthrogram.    One to 1.25 ml of the steroid/anesthetic solution was then injected into each of the facet joints noted above.   Additional Comments:  The patient tolerated the procedure well Dressing: 2 x 2 sterile gauze and Band-Aid    Post-procedure details: Patient was observed during the procedure. Post-procedure instructions were reviewed.  Patient left the clinic in stable condition.    Clinical History: EXAM: MRI LUMBAR SPINE WITHOUT CONTRAST   TECHNIQUE: Multiplanar, multisequence MR imaging of the lumbar spine was performed. No intravenous contrast was administered.   COMPARISON:  Lumbar spine MRI 11/21/2020   FINDINGS: Segmentation: Standard; the lowest formed disc space is designated L5-S1.   Alignment: There is dextrocurvature centered at L2-L3, unchanged. There is unchanged trace  retrolisthesis of T12 on L1.   Vertebrae: Vertebral body heights are preserved, without evidence of acute injury. There is degenerative endplate signal abnormality with mild edema along the left aspect of the L1-L2 disc space, increased since the prior study. There is additional multilevel degenerative endplate signal abnormality without edema most notable at at L2-L3 which is similar to the prior study.   There is marrow edema in the right posterior elements at L4 with advanced facet arthropathy at this level. There is T1 hypointensity and cortical irregularity raising the possibility of septic arthritis. Findings are progressed since the prior study.   Conus medullaris and cauda equina: Conus extends to the L1-L2 level. Conus and cauda equina appear normal.   Paraspinal and other soft tissues: There is perifacetal soft tissue edema on the left at L3-L4 and on the right at L4-L5, more notable at L4-L5.   Disc levels:   T10-T11: Facet arthropathy resulting in moderate right neural foraminal stenosis unchanged.   T11-T12: There is a shallow central protrusion without significant spinal canal or neural foraminal stenosis, unchanged.   T12-L1: There is a disc bulge eccentric to the right and bilateral facet arthropathy without significant spinal canal or neural foraminal stenosis   L1-L2: There is a disc bulge eccentric to the left and left worse than right facet arthropathy resulting in mild left subarticular zone narrowing and moderate left and no significant right neural foraminal stenosis. The left neural foraminal stenosis is slightly progressed.   L2-L3: There is left worse than right degenerative endplate spurring and facet arthropathy resulting in mild spinal canal and left subarticular zone narrowing and mild to moderate left and mild right neural foraminal stenosis, not significantly changed.   L3-L4: There is a disc bulge eccentric to the left and moderate  left worse than right facet arthropathy resulting in mild-to-moderate spinal canal stenosis and moderate right and mild left neural foraminal stenosis unchanged.   L4-L5: There is a left foraminal annular fissure. There is severe right facet arthropathy with possible cortical destruction and small bilateral facet joint effusions which has progressed since the prior study. Findings result in mild right and no significant left neural foraminal stenosis and no significant spinal canal stenosis.   L5-S1: There is a right foraminal protrusion and moderate right facet arthropathy resulting in severe right and no significant left neural foraminal stenosis and no significant spinal canal stenosis, unchanged.   IMPRESSION: 1. Marrow edema in the right posterior elements at L4 with advanced facet arthropathy and possible cortical destruction and perifacetal soft tissue edema, progressed since the prior study. Findings may reflect advanced degenerative change; however, septic arthritis could have a similar appearance. Correlate with symptoms and inflammatory markers. 2. Moderate left neural foraminal stenosis at L1-L2 is slightly progressed since the prior study. 3. 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 4. Unchanged dextrocurvature centered at L2-L3.     Electronically Signed   By: Lesia Hausen M.D.   On: 09/10/2022 13:32     Objective:  VS:  HT:    WT:   BMI:     BP:   HR: bpm  TEMP: ( )  RESP:  Physical Exam Vitals and nursing note reviewed.  Constitutional:  General: She is not in acute distress.    Appearance: Normal appearance. She is well-developed. She is not ill-appearing.  HENT:     Head: Normocephalic and atraumatic.  Eyes:     Conjunctiva/sclera: Conjunctivae normal.     Pupils: Pupils are equal, round, and reactive to light.   Cardiovascular:     Rate and Rhythm: Normal rate.     Pulses: Normal pulses.  Pulmonary:     Effort: Pulmonary effort is normal.  Musculoskeletal:     Right lower leg: No edema.     Left lower leg: No edema.  Skin:    General: Skin is warm and dry.     Findings: No erythema or rash.  Neurological:     General: No focal deficit present.     Mental Status: She is alert and oriented to person, place, and time.     Sensory: No sensory deficit.     Motor: No abnormal muscle tone.     Coordination: Coordination normal.     Gait: Gait normal.  Psychiatric:        Mood and Affect: Mood normal.        Behavior: Behavior normal.      Imaging: No results found.

## 2023-10-11 DIAGNOSIS — D1801 Hemangioma of skin and subcutaneous tissue: Secondary | ICD-10-CM | POA: Diagnosis not present

## 2023-10-11 DIAGNOSIS — L821 Other seborrheic keratosis: Secondary | ICD-10-CM | POA: Diagnosis not present

## 2023-10-11 DIAGNOSIS — L578 Other skin changes due to chronic exposure to nonionizing radiation: Secondary | ICD-10-CM | POA: Diagnosis not present

## 2023-10-11 DIAGNOSIS — L814 Other melanin hyperpigmentation: Secondary | ICD-10-CM | POA: Diagnosis not present

## 2023-10-14 DIAGNOSIS — F330 Major depressive disorder, recurrent, mild: Secondary | ICD-10-CM | POA: Diagnosis not present

## 2023-10-14 DIAGNOSIS — F411 Generalized anxiety disorder: Secondary | ICD-10-CM | POA: Diagnosis not present

## 2023-10-14 DIAGNOSIS — G4700 Insomnia, unspecified: Secondary | ICD-10-CM | POA: Diagnosis not present

## 2023-10-16 DIAGNOSIS — R292 Abnormal reflex: Secondary | ICD-10-CM | POA: Diagnosis not present

## 2023-10-16 DIAGNOSIS — M1288 Other specific arthropathies, not elsewhere classified, other specified site: Secondary | ICD-10-CM | POA: Diagnosis not present

## 2023-10-16 DIAGNOSIS — M4804 Spinal stenosis, thoracic region: Secondary | ICD-10-CM | POA: Diagnosis not present

## 2023-10-16 DIAGNOSIS — M4186 Other forms of scoliosis, lumbar region: Secondary | ICD-10-CM | POA: Diagnosis not present

## 2023-10-16 DIAGNOSIS — M5134 Other intervertebral disc degeneration, thoracic region: Secondary | ICD-10-CM | POA: Diagnosis not present

## 2023-11-04 DIAGNOSIS — G4700 Insomnia, unspecified: Secondary | ICD-10-CM | POA: Diagnosis not present

## 2023-11-04 DIAGNOSIS — F411 Generalized anxiety disorder: Secondary | ICD-10-CM | POA: Diagnosis not present

## 2023-11-04 DIAGNOSIS — F330 Major depressive disorder, recurrent, mild: Secondary | ICD-10-CM | POA: Diagnosis not present

## 2023-11-17 DIAGNOSIS — G4700 Insomnia, unspecified: Secondary | ICD-10-CM | POA: Diagnosis not present

## 2023-11-17 DIAGNOSIS — F411 Generalized anxiety disorder: Secondary | ICD-10-CM | POA: Diagnosis not present

## 2023-11-17 DIAGNOSIS — F330 Major depressive disorder, recurrent, mild: Secondary | ICD-10-CM | POA: Diagnosis not present

## 2023-12-05 ENCOUNTER — Telehealth: Admitting: Physician Assistant

## 2023-12-05 ENCOUNTER — Encounter: Payer: Self-pay | Admitting: Physician Assistant

## 2023-12-05 DIAGNOSIS — B9689 Other specified bacterial agents as the cause of diseases classified elsewhere: Secondary | ICD-10-CM

## 2023-12-05 DIAGNOSIS — J019 Acute sinusitis, unspecified: Secondary | ICD-10-CM | POA: Diagnosis not present

## 2023-12-05 MED ORDER — AMOXICILLIN-POT CLAVULANATE 875-125 MG PO TABS: 1.0000 | ORAL_TABLET | Freq: Two times a day (BID) | ORAL | 0 refills | Status: AC

## 2023-12-05 NOTE — Progress Notes (Signed)
E-Visit for Sinus Problems  We are sorry that you are not feeling well.  Here is how we plan to help!  Based on what you have shared with me it looks like you have sinusitis.  Sinusitis is inflammation and infection in the sinus cavities of the head.  Based on your presentation I believe you most likely have Acute Bacterial Sinusitis.  This is an infection caused by bacteria and is treated with antibiotics. I have prescribed Augmentin 875mg/125mg one tablet twice daily with food, for 7 days. You may use an oral decongestant such as Mucinex D or if you have glaucoma or high blood pressure use plain Mucinex. Saline nasal spray help and can safely be used as often as needed for congestion.  If you develop worsening sinus pain, fever or notice severe headache and vision changes, or if symptoms are not better after completion of antibiotic, please schedule an appointment with a health care provider.    Sinus infections are not as easily transmitted as other respiratory infection, however we still recommend that you avoid close contact with loved ones, especially the very young and elderly.  Remember to wash your hands thoroughly throughout the day as this is the number one way to prevent the spread of infection!  Home Care: Only take medications as instructed by your medical team. Complete the entire course of an antibiotic. Do not take these medications with alcohol. A steam or ultrasonic humidifier can help congestion.  You can place a towel over your head and breathe in the steam from hot water coming from a faucet. Avoid close contacts especially the very young and the elderly. Cover your mouth when you cough or sneeze. Always remember to wash your hands.  Get Help Right Away If: You develop worsening fever or sinus pain. You develop a severe head ache or visual changes. Your symptoms persist after you have completed your treatment plan.  Make sure you Understand these instructions. Will watch  your condition. Will get help right away if you are not doing well or get worse.  Thank you for choosing an e-visit.  Your e-visit answers were reviewed by a board certified advanced clinical practitioner to complete your personal care plan. Depending upon the condition, your plan could have included both over the counter or prescription medications.  Please review your pharmacy choice. Make sure the pharmacy is open so you can pick up prescription now. If there is a problem, you may contact your provider through MyChart messaging and have the prescription routed to another pharmacy.  Your safety is important to us. If you have drug allergies check your prescription carefully.   For the next 24 hours you can use MyChart to ask questions about today's visit, request a non-urgent call back, or ask for a work or school excuse. You will get an email in the next two days asking about your experience. I hope that your e-visit has been valuable and will speed your recovery.  I have spent 5 minutes in review of e-visit questionnaire, review and updating patient chart, medical decision making and response to patient.   Korey Prashad S Mayers, PA-C     

## 2023-12-08 DIAGNOSIS — F411 Generalized anxiety disorder: Secondary | ICD-10-CM | POA: Diagnosis not present

## 2023-12-08 DIAGNOSIS — G4700 Insomnia, unspecified: Secondary | ICD-10-CM | POA: Diagnosis not present

## 2023-12-08 DIAGNOSIS — F330 Major depressive disorder, recurrent, mild: Secondary | ICD-10-CM | POA: Diagnosis not present

## 2024-06-12 ENCOUNTER — Encounter: Payer: Self-pay | Admitting: Radiology
